# Patient Record
Sex: Female | Born: 1970 | Hispanic: No | Marital: Married | Smoking: Never smoker
Health system: Southern US, Community
[De-identification: ages and names within clinical notes are randomized; demographics above are authoritative.]

## PROBLEM LIST (undated history)

## (undated) ENCOUNTER — Inpatient Hospital Stay (HOSPITAL_COMMUNITY): Payer: Self-pay

## (undated) DIAGNOSIS — Z8719 Personal history of other diseases of the digestive system: Secondary | ICD-10-CM

## (undated) DIAGNOSIS — R519 Headache, unspecified: Secondary | ICD-10-CM

## (undated) DIAGNOSIS — D649 Anemia, unspecified: Secondary | ICD-10-CM

## (undated) DIAGNOSIS — Z833 Family history of diabetes mellitus: Secondary | ICD-10-CM

## (undated) DIAGNOSIS — Z6791 Unspecified blood type, Rh negative: Secondary | ICD-10-CM

## (undated) DIAGNOSIS — Z8613 Personal history of malaria: Secondary | ICD-10-CM

## (undated) DIAGNOSIS — O24419 Gestational diabetes mellitus in pregnancy, unspecified control: Secondary | ICD-10-CM

## (undated) DIAGNOSIS — Z22330 Carrier of Group B streptococcus: Secondary | ICD-10-CM

## (undated) DIAGNOSIS — Z862 Personal history of diseases of the blood and blood-forming organs and certain disorders involving the immune mechanism: Secondary | ICD-10-CM

## (undated) DIAGNOSIS — O26899 Other specified pregnancy related conditions, unspecified trimester: Secondary | ICD-10-CM

## (undated) DIAGNOSIS — K469 Unspecified abdominal hernia without obstruction or gangrene: Secondary | ICD-10-CM

## (undated) DIAGNOSIS — N90810 Female genital mutilation status, unspecified: Secondary | ICD-10-CM

## (undated) DIAGNOSIS — Z6741 Type O blood, Rh negative: Secondary | ICD-10-CM

## (undated) DIAGNOSIS — R51 Headache: Secondary | ICD-10-CM

## (undated) DIAGNOSIS — O09529 Supervision of elderly multigravida, unspecified trimester: Secondary | ICD-10-CM

## (undated) HISTORY — DX: Anemia, unspecified: D64.9

## (undated) HISTORY — DX: Family history of diabetes mellitus: Z83.3

## (undated) HISTORY — DX: Unspecified blood type, rh negative: Z67.91

## (undated) HISTORY — DX: Other specified pregnancy related conditions, unspecified trimester: O26.899

## (undated) HISTORY — DX: Female genital mutilation status, unspecified: N90.810

## (undated) HISTORY — PX: UPPER GI ENDOSCOPY: SHX6162

## (undated) HISTORY — DX: Personal history of diseases of the blood and blood-forming organs and certain disorders involving the immune mechanism: Z86.2

## (undated) HISTORY — DX: Personal history of other diseases of the digestive system: Z87.19

## (undated) HISTORY — DX: Personal history of malaria: Z86.13

## (undated) HISTORY — DX: Supervision of elderly multigravida, unspecified trimester: O09.529

## (undated) HISTORY — DX: Carrier of group B Streptococcus: Z22.330

---

## 2008-04-10 HISTORY — PX: GALLBLADDER SURGERY: SHX652

## 2008-12-27 ENCOUNTER — Inpatient Hospital Stay (HOSPITAL_COMMUNITY): Admission: AD | Admit: 2008-12-27 | Discharge: 2008-12-28 | Payer: Self-pay | Admitting: Obstetrics and Gynecology

## 2009-02-11 ENCOUNTER — Encounter: Admission: RE | Admit: 2009-02-11 | Discharge: 2009-02-11 | Payer: Self-pay | Admitting: Obstetrics and Gynecology

## 2009-02-11 ENCOUNTER — Ambulatory Visit (HOSPITAL_COMMUNITY): Admission: RE | Admit: 2009-02-11 | Discharge: 2009-02-11 | Payer: Self-pay | Admitting: Obstetrics and Gynecology

## 2009-02-23 ENCOUNTER — Inpatient Hospital Stay (HOSPITAL_COMMUNITY): Admission: AD | Admit: 2009-02-23 | Discharge: 2009-02-26 | Payer: Self-pay | Admitting: Obstetrics and Gynecology

## 2009-03-14 ENCOUNTER — Emergency Department (HOSPITAL_COMMUNITY): Admission: EM | Admit: 2009-03-14 | Discharge: 2009-03-14 | Payer: Self-pay | Admitting: Emergency Medicine

## 2009-03-29 ENCOUNTER — Ambulatory Visit: Payer: Self-pay | Admitting: Family Medicine

## 2009-03-29 ENCOUNTER — Inpatient Hospital Stay (HOSPITAL_COMMUNITY): Admission: EM | Admit: 2009-03-29 | Discharge: 2009-04-01 | Payer: Self-pay | Admitting: Emergency Medicine

## 2009-03-31 ENCOUNTER — Encounter: Payer: Self-pay | Admitting: Family Medicine

## 2009-04-07 DIAGNOSIS — Z8719 Personal history of other diseases of the digestive system: Secondary | ICD-10-CM

## 2009-04-07 HISTORY — DX: Personal history of other diseases of the digestive system: Z87.19

## 2009-09-02 DIAGNOSIS — Z8719 Personal history of other diseases of the digestive system: Secondary | ICD-10-CM

## 2009-09-02 HISTORY — DX: Personal history of other diseases of the digestive system: Z87.19

## 2010-07-11 LAB — BASIC METABOLIC PANEL
BUN: 7 mg/dL (ref 6–23)
CO2: 28 mEq/L (ref 19–32)
Calcium: 8.6 mg/dL (ref 8.4–10.5)
GFR calc non Af Amer: >60 mL/min (ref >60)
Glucose, Bld: 104 mg/dL — ABNORMAL HIGH (ref 70–99)
Sodium: 137 mEq/L (ref 135–145)

## 2010-07-11 LAB — CBC
Hemoglobin: 12.8 g/dL (ref 12.0–15.0)
MCHC: 33.5 g/dL (ref 30.0–36.0)
Platelets: 225 10*3/uL (ref 150–400)
RDW: 16 % — ABNORMAL HIGH (ref 11.5–15.5)

## 2010-07-11 LAB — COMPREHENSIVE METABOLIC PANEL
AST: 27 U/L (ref 0–37)
Albumin: 3.1 g/dL — ABNORMAL LOW (ref 3.5–5.2)
CO2: 28 mEq/L (ref 19–32)
Calcium: 8 mg/dL — ABNORMAL LOW (ref 8.4–10.5)
Creatinine, Ser: 0.54 mg/dL (ref 0.4–1.2)
GFR calc Af Amer: >60 mL/min (ref >60)
GFR calc non Af Amer: >60 mL/min (ref >60)
Total Protein: 6.1 g/dL (ref 6.0–8.3)

## 2010-07-11 LAB — URINALYSIS, MICROSCOPIC ONLY
Glucose, UA: NEGATIVE mg/dL
Hgb urine dipstick: NEGATIVE
Ketones, ur: NEGATIVE mg/dL
Protein, ur: NEGATIVE mg/dL

## 2010-07-11 LAB — DIFFERENTIAL
Basophils Absolute: 0 10*3/uL (ref 0.0–0.1)
Basophils Relative: 0 % (ref 0–1)
Eosinophils Relative: 1 % (ref 0–5)
Monocytes Absolute: 0.1 10*3/uL (ref 0.1–1.0)
Neutro Abs: 6.8 10*3/uL (ref 1.7–7.7)

## 2010-07-11 LAB — HEPATIC FUNCTION PANEL
AST: 130 U/L — ABNORMAL HIGH (ref 0–37)
Bilirubin, Direct: 0.4 mg/dL — ABNORMAL HIGH (ref 0.0–0.3)
Indirect Bilirubin: 0.5 mg/dL (ref 0.3–0.9)
Total Protein: 6.5 g/dL (ref 6.0–8.3)

## 2010-07-11 LAB — URINE CULTURE: Colony Count: 30000

## 2010-07-11 LAB — TYPE AND SCREEN
ABO/RH(D): O NEG
Antibody Screen: NEGATIVE

## 2010-07-12 LAB — BASIC METABOLIC PANEL
Calcium: 8.3 mg/dL — ABNORMAL LOW (ref 8.4–10.5)
GFR calc Af Amer: >60 mL/min (ref >60)
GFR calc non Af Amer: >60 mL/min (ref >60)
Glucose, Bld: 112 mg/dL — ABNORMAL HIGH (ref 70–99)
Sodium: 138 mEq/L (ref 135–145)

## 2010-07-12 LAB — DIFFERENTIAL
Basophils Absolute: 0.1 10*3/uL (ref 0.0–0.1)
Lymphocytes Relative: 21 % (ref 12–46)
Monocytes Absolute: 0.5 10*3/uL (ref 0.1–1.0)
Neutro Abs: 4.6 10*3/uL (ref 1.7–7.7)

## 2010-07-12 LAB — URINE MICROSCOPIC-ADD ON

## 2010-07-12 LAB — CBC
Hemoglobin: 11.8 g/dL — ABNORMAL LOW (ref 12.0–15.0)
RBC: 4.13 MIL/uL (ref 3.87–5.11)
RDW: 15.9 % — ABNORMAL HIGH (ref 11.5–15.5)

## 2010-07-12 LAB — URINALYSIS, ROUTINE W REFLEX MICROSCOPIC
Bilirubin Urine: NEGATIVE
Nitrite: NEGATIVE
Specific Gravity, Urine: 1.026 (ref 1.005–1.030)
pH: 5.5 (ref 5.0–8.0)

## 2010-07-12 LAB — POCT PREGNANCY, URINE: Preg Test, Ur: NEGATIVE

## 2010-07-13 LAB — CBC
HCT: 29.1 % — ABNORMAL LOW (ref 36.0–46.0)
HCT: 32.4 % — ABNORMAL LOW (ref 36.0–46.0)
Hemoglobin: 10.8 g/dL — ABNORMAL LOW (ref 12.0–15.0)
MCV: 87.3 fL (ref 78.0–100.0)
Platelets: 219 10*3/uL (ref 150–400)
RBC: 3.33 MIL/uL — ABNORMAL LOW (ref 3.87–5.11)
RDW: 14.1 % (ref 11.5–15.5)
WBC: 8.2 10*3/uL (ref 4.0–10.5)

## 2010-07-15 LAB — RH IMMUNE GLOBULIN WORKUP (NOT WOMEN'S HOSP)

## 2011-04-11 NOTE — L&D Delivery Note (Addendum)
Delivery Note At 9:10 AM a viable female, "   ", was delivered via Vaginal, Spontaneous Delivery (Presentation: Right Occiput Anterior).  APGAR: 9, 9; weight 7 lb 6.9 oz (3371 g).   Placenta status: Intact, Spontaneous.  Cord: 3 vessels with the following complications: None.  Cord pH: NA  Anesthesia: Epidural  Episiotomy: Median Lacerations: 2nd degree ML episiotomy, 1st degree tear of female circumcision area upward.  Red Robinson catheter inserted into the urethra for localization.  4-0 monocryl utilized for repair of the upward tear of the site of the previous clitorectomy. 3-0 monocryl used for repair of the MLE.  Rectal sphincter intact, good tone.  Additional Lidocaine infiltration used due to incomplete anesthesia from epidural. Suture Repair: 3.0 and 4.0 monocryl. Est. Blood Loss (mL): 300 cc  Mom to postpartum.  Baby to bedside care, stable. Inpatient circumcision planned.  Nigel Bridgeman 02/19/2012, 10:57 AM

## 2011-05-07 IMAGING — CR DG ABDOMEN ACUTE W/ 1V CHEST
3 series · 3 of 3 positions shown · non-contrast
Comparison: None

CLINICAL DATA: Abdominal pain.

ACUTE ABDOMEN SERIES (ABDOMEN 2 VIEW & CHEST 1 VIEW)

[w chest pa]
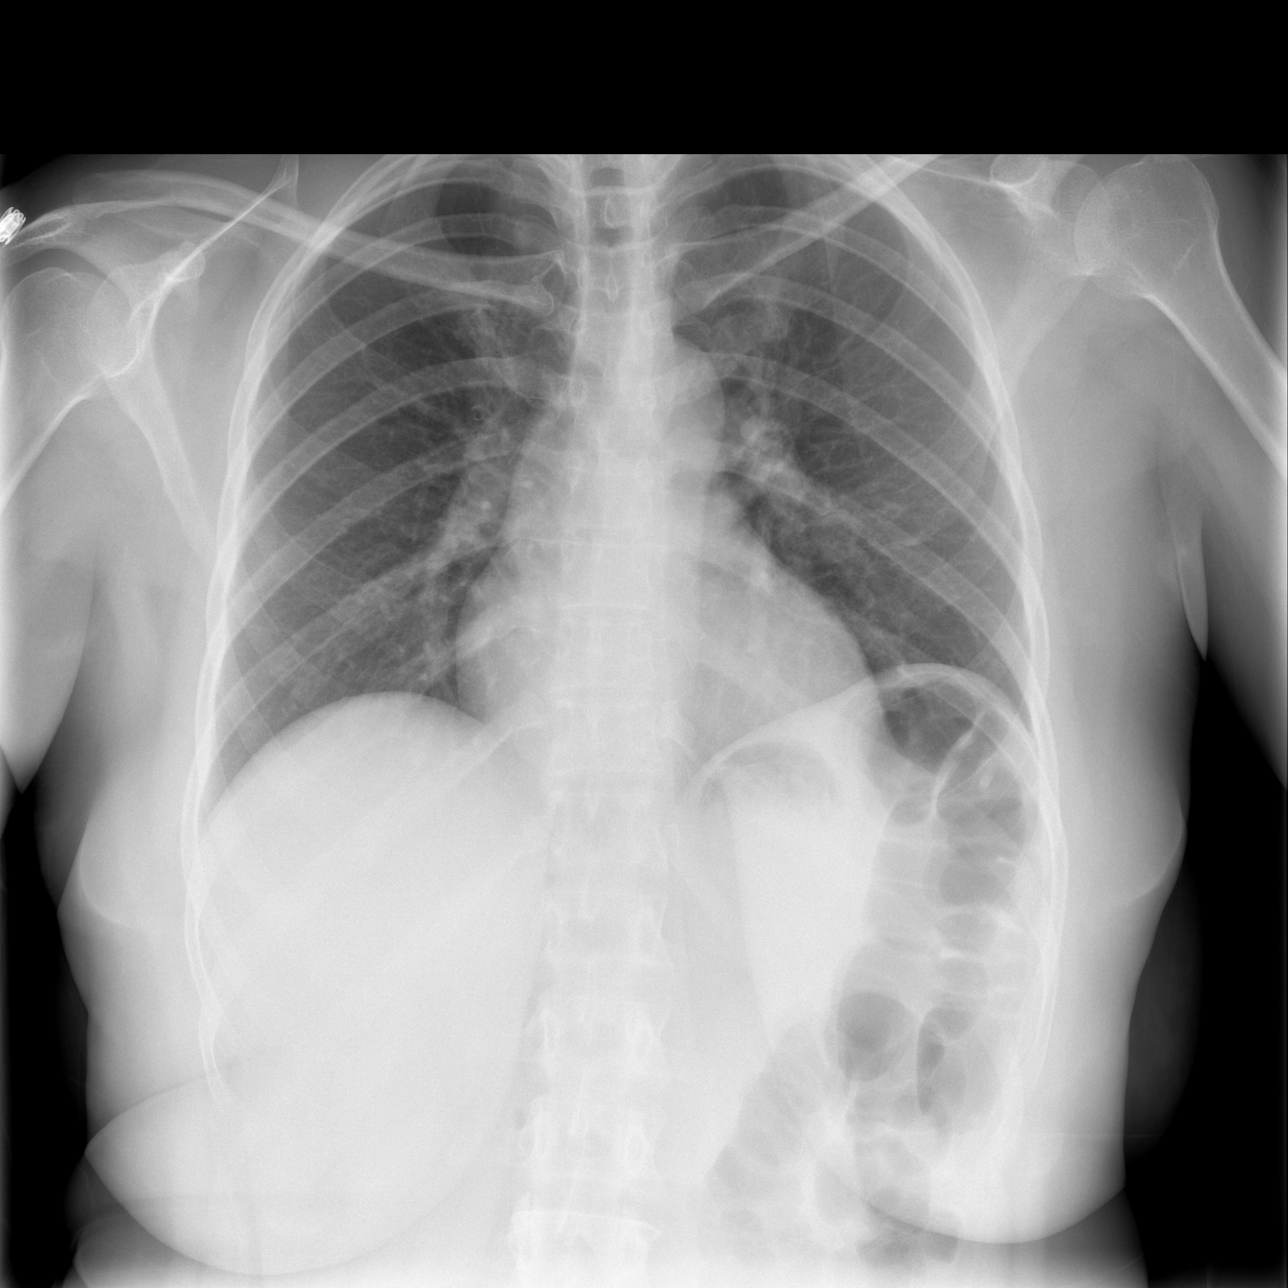

[w abdomen upright]
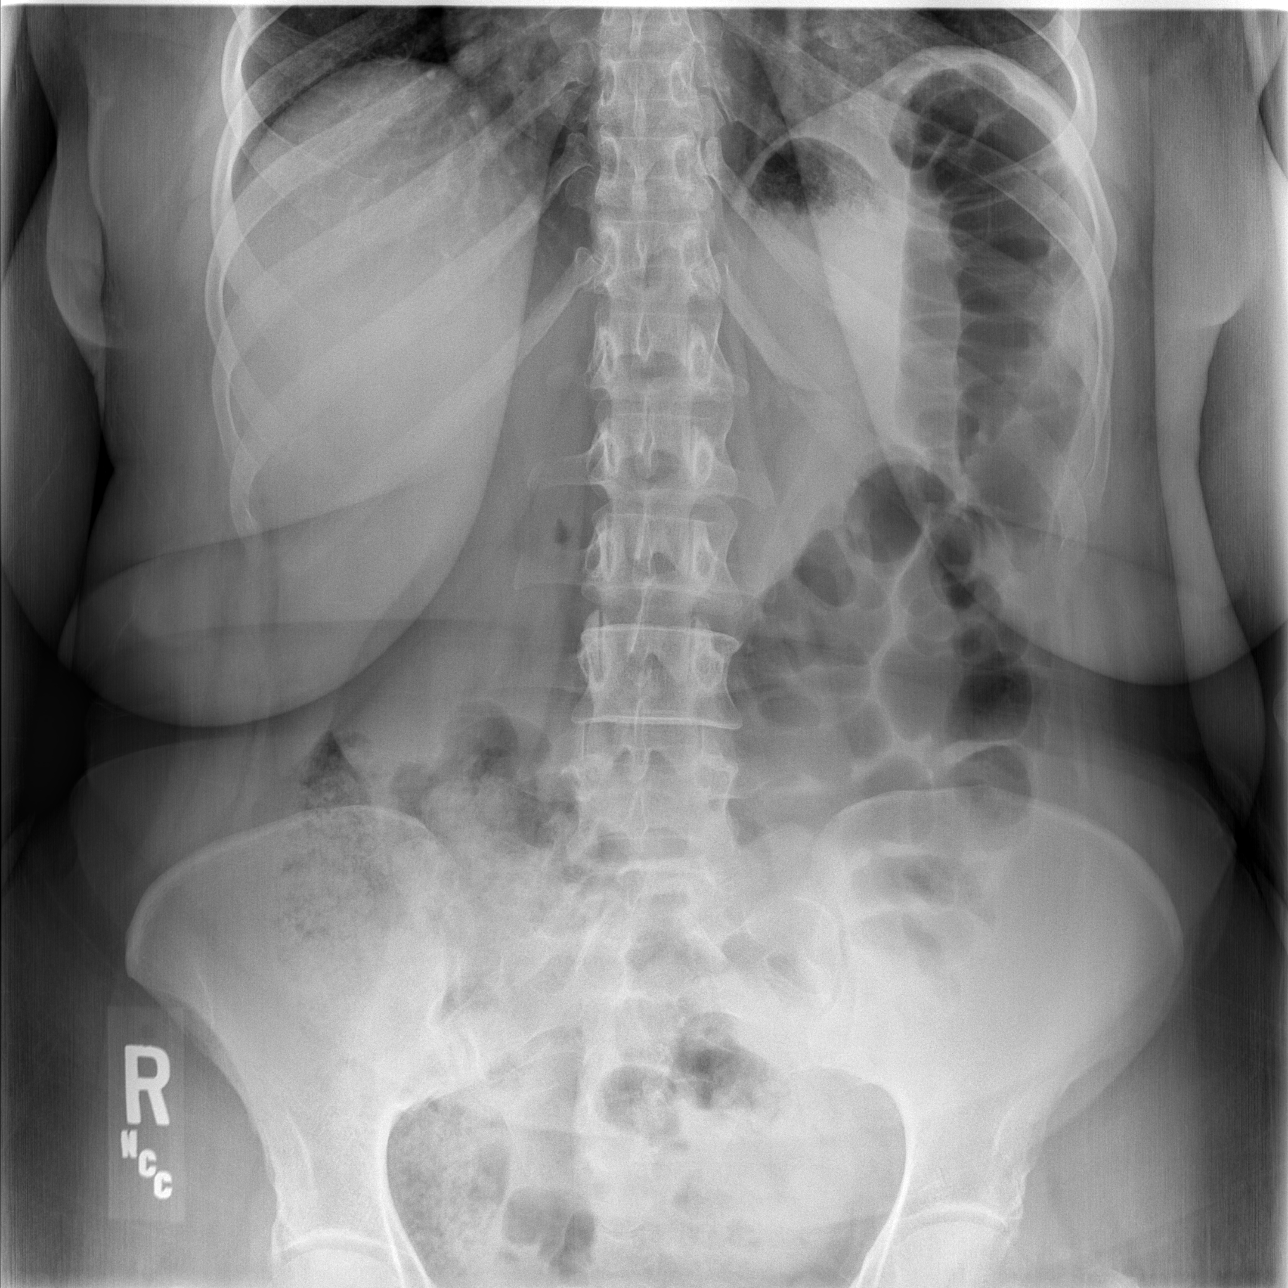

[t abdomen supine]
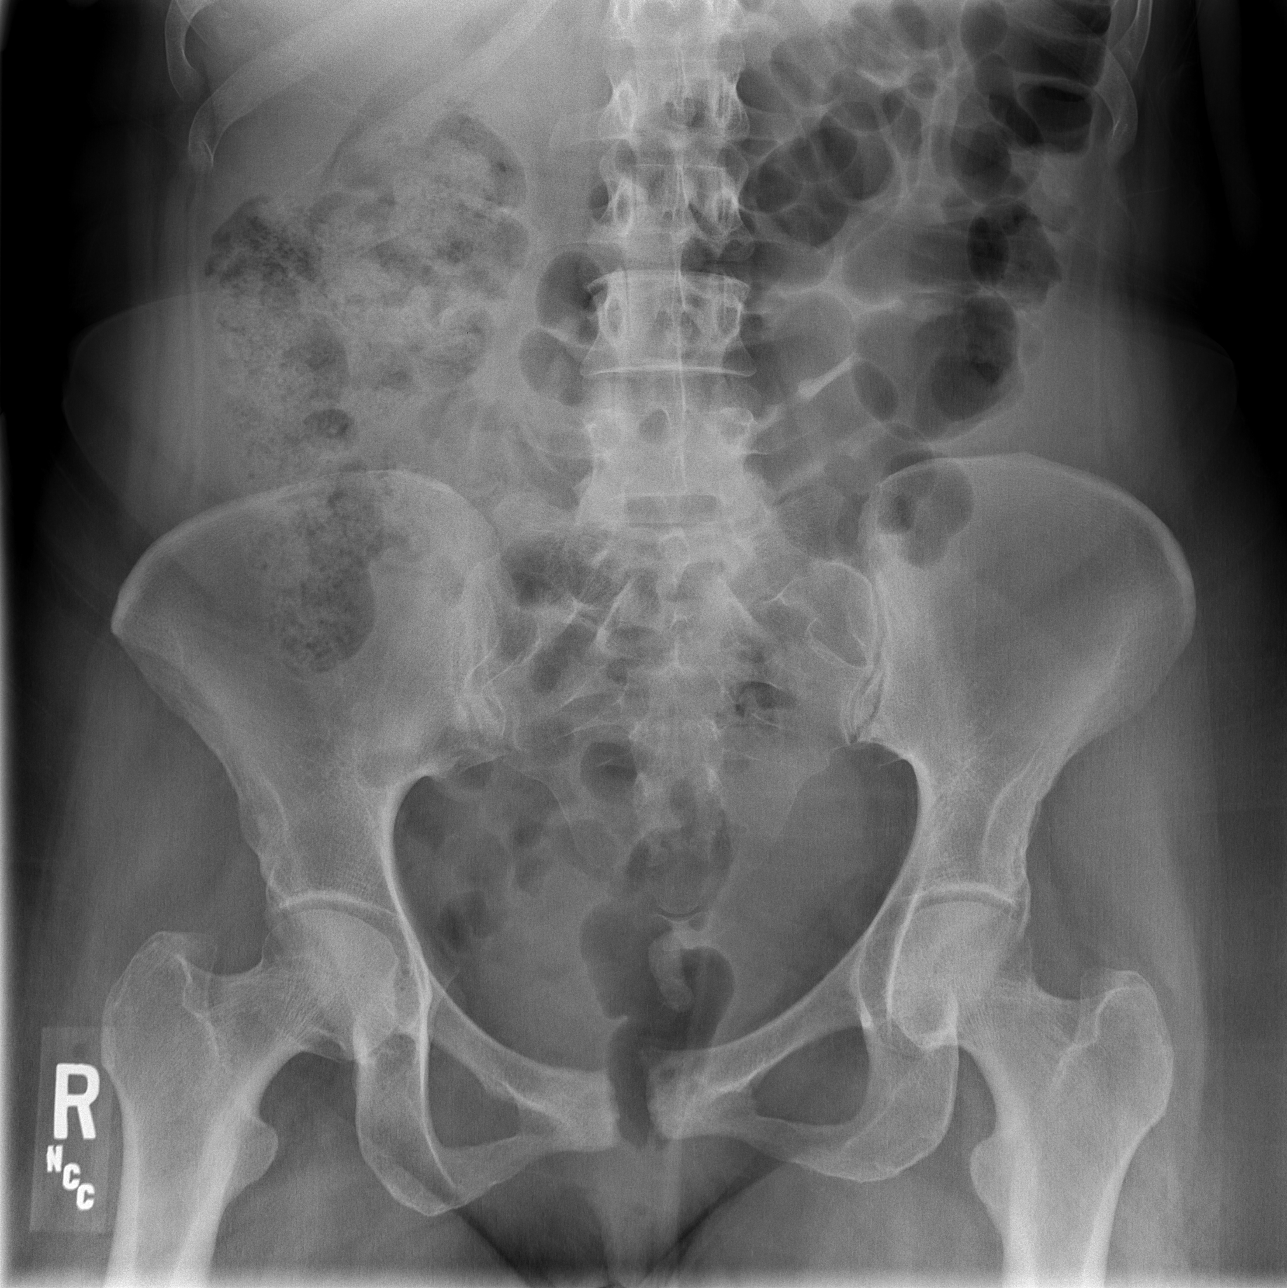

[3 of 3 positions shown; findings below may reference images not displayed]

FINDINGS: The bowel gas pattern is normal.  There is no evidence of
free intraperitoneal air.  No suspicious radio-opaque calculi or
other significant radiographic abnormality is seen. Heart size and
mediastinal contours are within normal limits.  Both lungs are
clear.
IMPRESSION: No acute findings.

## 2011-07-03 ENCOUNTER — Encounter (INDEPENDENT_AMBULATORY_CARE_PROVIDER_SITE_OTHER): Payer: 59 | Admitting: Obstetrics and Gynecology

## 2011-07-03 DIAGNOSIS — N912 Amenorrhea, unspecified: Secondary | ICD-10-CM

## 2011-07-18 ENCOUNTER — Telehealth: Payer: Self-pay | Admitting: Obstetrics and Gynecology

## 2011-07-19 ENCOUNTER — Other Ambulatory Visit: Payer: Self-pay | Admitting: Obstetrics and Gynecology

## 2011-07-19 ENCOUNTER — Telehealth: Payer: Self-pay | Admitting: Obstetrics and Gynecology

## 2011-07-19 MED ORDER — ONDANSETRON HCL 8 MG PO TABS: 8.0000 mg | ORAL_TABLET | Freq: Three times a day (TID) | ORAL | Status: AC | PRN

## 2011-07-19 NOTE — Telephone Encounter (Signed)
TC to pt. LM to return call.  

## 2011-07-19 NOTE — Telephone Encounter (Signed)
TC from pt's husband.  States is having vomiting 2x/day.  +Nausea.   Is tolerating food and fluid.  Will consult w/provider for RX.  Advised if takes Zofran to take Colace qd to prevent constipation.  To call if no improvement or unable to retain food or fluids x 24 hrs.   Verbalizes comprehension.

## 2011-07-20 MED ORDER — PROMETHAZINE HCL 25 MG PO TABS: 25.0000 mg | ORAL_TABLET | Freq: Four times a day (QID) | ORAL | Status: AC | PRN

## 2011-07-20 NOTE — Telephone Encounter (Signed)
TC to pt's husband.   States pt does not want to take Zofran due to hx of constipation  when taking 2010.  Requesting different medication for nausea. Per Dr Maryellen Pile RX for Phenergan ordered.   Advised to have pt wait 30-45 min after taking and the eat and drink .  Verbalizes comprehension.

## 2011-07-27 NOTE — Telephone Encounter (Signed)
Spoke with pt's husband.States pt is very fatigued with Phenergan.  Suggested taking half dose.   Use Sea [employer]ands.  Call if no improvement or N+V occur.  Verbalizes comprehension.

## 2011-07-28 ENCOUNTER — Telehealth: Payer: Self-pay

## 2011-07-28 NOTE — Telephone Encounter (Signed)
DENTAL NOTE FAXED TO [facility] [phone].

## 2011-08-03 DIAGNOSIS — Z789 Other specified health status: Secondary | ICD-10-CM

## 2011-08-04 ENCOUNTER — Encounter: Payer: 59 | Admitting: Obstetrics and Gynecology

## 2011-08-09 ENCOUNTER — Ambulatory Visit (INDEPENDENT_AMBULATORY_CARE_PROVIDER_SITE_OTHER): Payer: 59

## 2011-08-09 ENCOUNTER — Ambulatory Visit (INDEPENDENT_AMBULATORY_CARE_PROVIDER_SITE_OTHER): Payer: 59 | Admitting: Obstetrics and Gynecology

## 2011-08-09 VITALS — BP 114/70 | Ht 65.0 in | Wt 186.0 lb

## 2011-08-09 DIAGNOSIS — Z331 Pregnant state, incidental: Secondary | ICD-10-CM

## 2011-08-09 DIAGNOSIS — Z8613 Personal history of malaria: Secondary | ICD-10-CM | POA: Insufficient documentation

## 2011-08-09 LAB — POCT URINALYSIS DIPSTICK
Leukocytes, UA: NEGATIVE
Urobilinogen, UA: NEGATIVE

## 2011-08-09 NOTE — Progress Notes (Signed)
Pt states no bldg; however u a shows +3 blood  Lm. Pt c/o nausea

## 2011-08-10 ENCOUNTER — Encounter: Payer: 59 | Admitting: Obstetrics and Gynecology

## 2011-08-10 LAB — PRENATAL PANEL VII
Antibody Screen: NEGATIVE
Basophils Absolute: 0 10*3/uL (ref 0.0–0.1)
Basophils Relative: 0 % (ref 0–1)
Eosinophils Relative: 2 % (ref 0–5)
HIV: NONREACTIVE
Lymphocytes Relative: 42 % (ref 12–46)
Neutro Abs: 2.1 10*3/uL (ref 1.7–7.7)
Platelets: 244 10*3/uL (ref 150–400)
RDW: 12.8 % (ref 11.5–15.5)
WBC: 4.5 10*3/uL (ref 4.0–10.5)

## 2011-08-11 LAB — HEMOGLOBINOPATHY EVALUATION
Hgb A2 Quant: 2.9 % (ref 2.2–3.2)
Hgb F Quant: 0 % (ref 0.0–2.0)
Hgb S Quant: 0 %

## 2011-08-11 LAB — CULTURE, OB URINE: Organism ID, Bacteria: NO GROWTH

## 2011-08-15 ENCOUNTER — Other Ambulatory Visit: Payer: Self-pay | Admitting: Obstetrics and Gynecology

## 2011-08-15 NOTE — Telephone Encounter (Signed)
Triage received 

## 2011-08-16 ENCOUNTER — Other Ambulatory Visit: Payer: Self-pay

## 2011-08-16 ENCOUNTER — Telehealth: Payer: Self-pay

## 2011-08-16 MED ORDER — VITAFOL-ONE 29-1-200 MG PO CAPS: 1.0000 | ORAL_CAPSULE | Freq: Every day | ORAL | Status: DC

## 2011-08-16 NOTE — Telephone Encounter (Signed)
Rx Vitafol-One called in to [facility] per [employer] was left for pt to call back regarding Rx request.

## 2011-08-16 NOTE — Telephone Encounter (Signed)
Tc from pt's husband. Needs rf on pnv's. Rx for Vitafol e-pres to pharm on file. Caller voices understanding.

## 2011-08-29 ENCOUNTER — Encounter: Payer: Self-pay | Admitting: Obstetrics and Gynecology

## 2011-08-29 ENCOUNTER — Ambulatory Visit (INDEPENDENT_AMBULATORY_CARE_PROVIDER_SITE_OTHER): Payer: 59 | Admitting: Obstetrics and Gynecology

## 2011-08-29 VITALS — BP 110/70 | Wt 194.0 lb

## 2011-08-29 DIAGNOSIS — O09299 Supervision of pregnancy with other poor reproductive or obstetric history, unspecified trimester: Secondary | ICD-10-CM

## 2011-08-29 DIAGNOSIS — O09529 Supervision of elderly multigravida, unspecified trimester: Secondary | ICD-10-CM

## 2011-08-29 DIAGNOSIS — K219 Gastro-esophageal reflux disease without esophagitis: Secondary | ICD-10-CM

## 2011-08-29 DIAGNOSIS — O219 Vomiting of pregnancy, unspecified: Secondary | ICD-10-CM

## 2011-08-29 MED ORDER — PANTOPRAZOLE SODIUM 40 MG PO TBEC: 40.0000 mg | DELAYED_RELEASE_TABLET | Freq: Every day | ORAL | Status: DC

## 2011-08-29 NOTE — Progress Notes (Signed)
Pap done 04/2011 WNL  C/o acid reflux after eating  Pt declines genetic screenings

## 2011-08-29 NOTE — Progress Notes (Signed)
Subjective:    Jessica Cervantes is being seen today for her first obstetrical visit.  She is at [redacted]w[redacted]d gestation by LMP.   She reports continuing issues with nausea and vomiting, but improving. Has used Zofran and Phenergan, but has stopped both due to side effects of constipation and sedation, respectively.  Her obstetrical history is significant for: AMA Advanced paternal age Hx malaria Language barrier (Arabic)--speaks some English Reflux Hx gestational diabetes with previous pregnancy  Relationship with FOB: spouse, living together.  Patient does intend to breast feed.  Pregnancy history fully reviewed.   The following portions of the patient's history were reviewed and updated as appropriate: allergies, current medications, past family history, past medical history, past social history, past surgical history and problem list.  Review of Systems Pertinent ROS is described in HPI   Objective:   BP 110/70  Wt 194 lb (87.998 kg)  LMP 05/23/2011 Wt Readings from Last 1 Encounters:  08/29/11 194 lb (87.998 kg)   BMI: There is no height on file to calculate BMI.  General: alert, cooperative and no distress Respiratory: clear to auscultation bilaterally Cardiovascular: regular rate and rhythm, S1, S2 normal, no murmur Gastrointestinal: soft, non-tender; no masses,  no organomegaly Extremities: extremities normal, no pain or edema Vaginal Bleeding: none  EXTERNAL GENITALIA: normal appearing vulva with no masses, tenderness or lesions VAGINA: no abnormal discharge or lesions CERVIX: no lesions or cervical motion tenderness UTERUS: gravid and consistent with 14 weeks ADNEXA: no masses palpable and nontender OB EXAM PELVIMETRY: appears adequate, proven   FHR:  150  bpm  Assessment:    Pregnancy at 14 and 0/7 weeks  Pregnancy risk status:  complicated by advanced maternal age and hx gestational diabetes, advanced paternal age, language barrier.    Plan:     Prenatal  panel reviewed and discussed with the patient:yes Pap smear collected:No--due in January, 2014. GC/Chlamydia collected:no, declined. Wet prep: NA Discussion of Quad Screen, Amniocentesis and Harmony (too late for 1st trimester screen)  undecided initially, then planned Quad screen.  However, I felt we had not talked adequately about Harmony--I asked CMA to call patient after visit to review again, to make sure patient did not want Harmony testing. Prenatal vitamins recommended Problem list reviewed and updated.  Plan of care: Follow up in 4 weeks. Additional testing planned: Early glucola, Level 2 anatomy scan at 18 weeks ([facility]), with early glucola Prescriptions given: Protonix for reflux.  Arthur Holms 08/29/2011 12:21 PM

## 2011-08-30 ENCOUNTER — Telehealth: Payer: Self-pay

## 2011-08-30 NOTE — Telephone Encounter (Signed)
Message copied by Janeece Agee on Wed Aug 30, 2011  8:56 AM ------      Message from: Cornelius Moras      Created: Wed Aug 30, 2011  8:02 AM      Regarding: Question about Cathlean Sauer       When I saw this patient yesterday, she was too late for 1st trimester screen, so we talked about Quad screen at [facility]--I forgot to ask her about Harmony...Marland KitchenMarland KitchenMarland Kitchen            Would you see if we can contact her (actually her husband), and see if she wants Harmony before the next visit?            My bad.........Marland Kitchen            VL

## 2011-08-31 ENCOUNTER — Other Ambulatory Visit: Payer: Self-pay

## 2011-08-31 DIAGNOSIS — Z1379 Encounter for other screening for genetic and chromosomal anomalies: Secondary | ICD-10-CM

## 2011-08-31 NOTE — Telephone Encounter (Signed)
Spoke with pt's husband due to wife's lack of English informing him we offered another 1st tri screening test called Harmony Test. Husband agreed to test. Appointment scheduled tomorrow at 3:30 pm for lab only visit. Pt's husband agrees and voices understanding.

## 2011-09-03 ENCOUNTER — Ambulatory Visit (INDEPENDENT_AMBULATORY_CARE_PROVIDER_SITE_OTHER): Payer: 59 | Admitting: Family Medicine

## 2011-09-03 VITALS — BP 93/63 | HR 89 | Temp 98.3°F | Resp 18 | Ht 65.0 in | Wt 195.0 lb

## 2011-09-03 DIAGNOSIS — N644 Mastodynia: Secondary | ICD-10-CM

## 2011-09-03 MED ORDER — CEPHALEXIN 500 MG PO CAPS: 500.0000 mg | ORAL_CAPSULE | Freq: Four times a day (QID) | ORAL | Status: AC

## 2011-09-03 NOTE — Progress Notes (Signed)
  Subjective:    Patient ID: Jessica Cervantes, female    DOB: 21-Oct-1970, 41 y.o.   MRN: [id number]  HPI  (L) breast swelling and soreness [redacted] weeks pregnant  Denies fever/chills Denies nipple discharge or skin changes  Stopped nursing son 1 year ago.; no history of mastitis  Review of Systems     Objective:   Physical Exam  Constitutional: She appears well-developed.  HENT:  Head: Normocephalic and atraumatic.  Neck: Neck supple. No thyromegaly present.  Cardiovascular: Normal rate, regular rhythm and normal heart sounds.   Pulmonary/Chest: Effort normal and breath sounds normal. Right breast exhibits no inverted nipple, no mass, no nipple discharge and no skin change. Left breast exhibits mass and tenderness. Left breast exhibits no inverted nipple, no nipple discharge and no skin change.    Neurological: She is alert.  Skin: Skin is warm.        Assessment & Plan:   1. Breast pain; tender mass cephALEXin (KEFLEX) 500 MG capsule, massage and heat   [facility] notified Harriett Sine) and they will contact patient for appointment this week.

## 2011-09-05 ENCOUNTER — Telehealth: Payer: Self-pay | Admitting: Obstetrics and Gynecology

## 2011-09-05 ENCOUNTER — Encounter: Payer: Self-pay | Admitting: Family Medicine

## 2011-09-05 NOTE — Telephone Encounter (Signed)
TC from Renee at [facility].  Pt was seen today by Dr Hal Hope for breast swelling and tenderness.  Firmness noted and questioning if U/S should be ordered at [facility] or pt to f/u with our office.  Pt is [redacted] wks pregnant.  Per SL to scheduled appt here for f/u.   TC to pt.   LM to return  Call.

## 2011-09-05 NOTE — Telephone Encounter (Signed)
Triage/elect. 

## 2011-09-05 NOTE — Telephone Encounter (Signed)
TC from pt's spouse.   Sched for evalw/ Dr SR 09/05/28.

## 2011-09-06 ENCOUNTER — Ambulatory Visit (INDEPENDENT_AMBULATORY_CARE_PROVIDER_SITE_OTHER): Payer: 59 | Admitting: Obstetrics and Gynecology

## 2011-09-06 VITALS — BP 110/60 | Wt 195.0 lb

## 2011-09-06 DIAGNOSIS — N644 Mastodynia: Secondary | ICD-10-CM

## 2011-09-06 DIAGNOSIS — O09529 Supervision of elderly multigravida, unspecified trimester: Secondary | ICD-10-CM

## 2011-09-06 NOTE — Progress Notes (Signed)
F/u breast tenderness . Pt stated no other issues .

## 2011-09-06 NOTE — Progress Notes (Signed)
Here today for Left breast exam. Was seen at [facility] for acute superficial breast pain and swelling. Given Keflex. Now pain resolved. Breast: small healing skin puncture with desquamation on 1 cm area. O/W breast exam is normal Suspect insect bite, healing well

## 2011-09-12 ENCOUNTER — Other Ambulatory Visit: Payer: Self-pay | Admitting: Obstetrics and Gynecology

## 2011-09-12 DIAGNOSIS — Z1379 Encounter for other screening for genetic and chromosomal anomalies: Secondary | ICD-10-CM

## 2011-09-25 ENCOUNTER — Ambulatory Visit (INDEPENDENT_AMBULATORY_CARE_PROVIDER_SITE_OTHER): Payer: 59 | Admitting: Obstetrics and Gynecology

## 2011-09-25 ENCOUNTER — Ambulatory Visit (INDEPENDENT_AMBULATORY_CARE_PROVIDER_SITE_OTHER): Payer: 59

## 2011-09-25 ENCOUNTER — Encounter: Payer: Self-pay | Admitting: Obstetrics and Gynecology

## 2011-09-25 ENCOUNTER — Other Ambulatory Visit: Payer: Self-pay | Admitting: Obstetrics and Gynecology

## 2011-09-25 VITALS — BP 110/62 | Wt 200.0 lb

## 2011-09-25 DIAGNOSIS — O09529 Supervision of elderly multigravida, unspecified trimester: Secondary | ICD-10-CM

## 2011-09-25 DIAGNOSIS — R059 Cough, unspecified: Secondary | ICD-10-CM

## 2011-09-25 DIAGNOSIS — Z36 Encounter for antenatal screening of mother: Secondary | ICD-10-CM

## 2011-09-25 DIAGNOSIS — R05 Cough: Secondary | ICD-10-CM

## 2011-09-25 MED ORDER — GUAIFENESIN-CODEINE 100-10 MG/5ML PO SYRP: 5.0000 mL | ORAL_SOLUTION | Freq: Three times a day (TID) | ORAL | Status: AC | PRN

## 2011-09-25 NOTE — Progress Notes (Signed)
Doing well except for hacking cough, non-productive, no fever or other URI sx Wants Rx for cough--Rx Robitussin AC to pharmacy. Encouraged patient to take Protonix as Rx'd--may help if some of cough is reflux related. Needs early glucola due to hx of GDM--will plan at [facility], since patient can't stay longer today. Declined genetic testing.  [place] today:  18 w 4 day, SIUP, cx 3.58. Anterior placenta.  Female infant, normal anatomy.  EDC 02/22/12, c/w previous dating.

## 2011-09-25 NOTE — Progress Notes (Signed)
No concerns per pt 

## 2011-09-26 LAB — US OB DETAIL + 14 WK

## 2011-09-28 NOTE — Progress Notes (Signed)
Plan this patient preferred not to see a female provider.  Her exam was rescheduled.

## 2011-10-09 ENCOUNTER — Telehealth: Payer: Self-pay | Admitting: Obstetrics and Gynecology

## 2011-10-09 NOTE — Telephone Encounter (Signed)
Spoke with pt husband states wife has severe coughing states rx given at last visit not working cough getting worse offered an appt pt has appt 10/10/11 at 4:15 husband voice understanding

## 2011-10-09 NOTE — Telephone Encounter (Signed)
Lm on vm tcb rgd msg 

## 2011-10-09 NOTE — Telephone Encounter (Signed)
Triage/epic 

## 2011-10-10 ENCOUNTER — Ambulatory Visit (INDEPENDENT_AMBULATORY_CARE_PROVIDER_SITE_OTHER): Payer: 59 | Admitting: Obstetrics and Gynecology

## 2011-10-10 VITALS — BP 100/78 | Wt 203.0 lb

## 2011-10-10 DIAGNOSIS — R05 Cough: Secondary | ICD-10-CM | POA: Insufficient documentation

## 2011-10-10 DIAGNOSIS — Z3490 Encounter for supervision of normal pregnancy, unspecified, unspecified trimester: Secondary | ICD-10-CM

## 2011-10-10 DIAGNOSIS — Z331 Pregnant state, incidental: Secondary | ICD-10-CM

## 2011-10-10 NOTE — Progress Notes (Signed)
C/o persistent cough for past 37 days unrelieved by Robitussin AC and patient can not sleep at night and has chest tenderness from persistent coughing. Lungs: Bilat Clear, no hx of pulmonary disease.  Referral to Pulmonologist. [place]  Requesting appt asap   Request to Triage pool  F/u progress of getting appt in am F/u Ob appt with VL 10/26/11 - scheduled.

## 2011-10-10 NOTE — Progress Notes (Signed)
C/O: chest pain when coughing  x 1 month  and 1 week now. Pt husband is here to translate and states the prescribed Rx Robitussin  did not help help w/ cough.  Temp 98.6; Oral

## 2011-10-11 ENCOUNTER — Telehealth: Payer: Self-pay

## 2011-10-11 ENCOUNTER — Other Ambulatory Visit: Payer: Self-pay

## 2011-10-11 NOTE — Telephone Encounter (Signed)
LM for pt regarding [facility] appt scheduled Aug. 12th @ 3:45 pm.

## 2011-10-11 NOTE — Telephone Encounter (Signed)
A user error has taken place: encounter opened in error, closed for administrative reasons.

## 2011-10-11 NOTE — Telephone Encounter (Signed)
Spoke with pt's husband and translater informing them appt is scheduled 11/20/11 @ 3:45 pm with Dr. Levy Pupa @ [facility]. Pt agrees and voices understanding.

## 2011-10-24 ENCOUNTER — Encounter: Payer: Self-pay | Admitting: Internal Medicine

## 2011-10-24 ENCOUNTER — Ambulatory Visit (INDEPENDENT_AMBULATORY_CARE_PROVIDER_SITE_OTHER): Payer: 59 | Admitting: Internal Medicine

## 2011-10-24 VITALS — BP 102/62 | HR 75 | Temp 98.1°F | Ht 65.0 in | Wt 198.0 lb

## 2011-10-24 DIAGNOSIS — R05 Cough: Secondary | ICD-10-CM

## 2011-10-24 MED ORDER — AMOXICILLIN-POT CLAVULANATE 875-125 MG PO TABS: 1.0000 | ORAL_TABLET | Freq: Two times a day (BID) | ORAL | Status: AC

## 2011-10-24 MED ORDER — PREDNISONE (PAK) 10 MG PO TABS: ORAL_TABLET | ORAL | Status: AC

## 2011-10-24 MED ORDER — LANSOPRAZOLE 30 MG PO CPDR: DELAYED_RELEASE_CAPSULE | ORAL | Status: DC

## 2011-10-24 NOTE — Progress Notes (Signed)
  Subjective:    Patient ID: Jessica Cervantes, female    DOB: 12-Feb-1971  MRN: [id number]  HPI  41 yo sudanese female never smoker with recurrent cough x 2010 lasts for months referred 10/24/2011 for recurrent cough while pregnant with 3rd child  10/24/2011 Wert/ ov cc acute onset cough x 3 months, waxes and wanes, worse at hs > white mostly but occ yellow mucus, mild doe but no ressing sob, assoc with nasal congestion.  No overt hb or sinus complaints  Sleeping ok without  early am exacerbation  of respiratory  c/o's or need for noct saba. Also denies any obvious fluctuation of symptoms with weather or environmental changes or other aggravating or alleviating factors except as outlined above   Review of Systems  Constitutional: Negative for fever, chills and unexpected weight change.  HENT: Positive for congestion, sore throat and dental problem. Negative for ear pain, nosebleeds, rhinorrhea, sneezing, trouble swallowing, voice change, postnasal drip and sinus pressure.   Eyes: Negative for visual disturbance.  Respiratory: Positive for cough and shortness of breath. Negative for choking.   Cardiovascular: Negative for chest pain and leg swelling.  Gastrointestinal: Negative for vomiting, abdominal pain and diarrhea.  Genitourinary: Negative for difficulty urinating.  Musculoskeletal: Negative for arthralgias.  Skin: Negative for rash.  Neurological: Negative for tremors, syncope and headaches.  Hematological: Does not bruise/bleed easily.       Objective:   Physical Exam amb sudanese female nad, congested sounding cough. Wt Readings from Last 3 Encounters:  10/26/11 202 lb (91.627 kg)  10/24/11 198 lb (89.812 kg)  10/10/11 203 lb (92.08 kg)    HEENT: nl dentition, turbinates, and orophanx. Nl external ear canals without cough reflex   NECK :  without JVD/Nodes/TM/ nl carotid upstrokes bilaterally   LUNGS: no acc muscle use, clear to A and P bilaterally without cough on insp or  exp maneuvers   CV:  RRR  no s3 or murmur or increase in P2, no edema   ABD:  soft and nontender with nl excursion in the supine position. No bruits or organomegaly, bowel sounds nl  MS:  warm without deformities, calf tenderness, cyanosis or clubbing  SKIN: warm and dry without lesions    NEURO:  alert, approp, no deficits         Assessment & Plan:

## 2011-10-24 NOTE — Patient Instructions (Addendum)
augmentin 875 twice daily x 10 days  Prednisone 10 mg take  4 each am x 2 days,   2 each am x 2 days,  1 each am x2days and stop  Prevacid 30 mg before bfast and supper until return   Robitussin with Codeine is a cough syrup to take every 4 hours   Please schedule a follow up office visit in 2 weeks, sooner if needed

## 2011-10-26 ENCOUNTER — Encounter: Payer: Self-pay | Admitting: Obstetrics and Gynecology

## 2011-10-26 ENCOUNTER — Other Ambulatory Visit: Payer: 59

## 2011-10-26 ENCOUNTER — Ambulatory Visit (INDEPENDENT_AMBULATORY_CARE_PROVIDER_SITE_OTHER): Payer: 59 | Admitting: Obstetrics and Gynecology

## 2011-10-26 VITALS — BP 100/68 | Wt 202.0 lb

## 2011-10-26 DIAGNOSIS — R05 Cough: Secondary | ICD-10-CM

## 2011-10-26 NOTE — Progress Notes (Signed)
Glucola today. Some low back and upper leg pain, particularly at night when tired.  No UTI symptoms, no leg swelling. Comfort measures reviewed. Cough better.

## 2011-10-26 NOTE — Progress Notes (Signed)
1 GTT GIVEN TODAY WITHOUT DIFFICULTY

## 2011-10-28 ENCOUNTER — Encounter: Payer: Self-pay | Admitting: Obstetrics and Gynecology

## 2011-10-28 LAB — GLUCOSE TOLERANCE, 1 HOUR (50G) W/O FASTING: Glucose, 1 Hour GTT: 239 mg/dL — ABNORMAL HIGH (ref 70–140)

## 2011-10-29 ENCOUNTER — Encounter: Payer: Self-pay | Admitting: Internal Medicine

## 2011-10-29 NOTE — Assessment & Plan Note (Signed)
The most common causes of chronic cough in immunocompetent adults include the following: upper airway cough syndrome (UACS), previously referred to as postnasal drip syndrome (PNDS), which is caused by variety of rhinosinus conditions; (2) asthma; (3) GERD; (4) chronic bronchitis from cigarette smoking or other inhaled environmental irritants; (5) nonasthmatic eosinophilic bronchitis; and (6) bronchiectasis.   These conditions, singly or in combination, have accounted for up to 94% of the causes of chronic cough in prospective studies.   Other conditions have constituted no >6% of the causes in prospective studies These have included bronchogenic carcinoma, chronic interstitial pneumonia, sarcoidosis, left ventricular failure, ACEI-induced cough, and aspiration from a condition associated with pharyngeal dysfunction.   .Chronic cough is often simultaneously caused by more than one condition. A single cause has been found from 38 to 82% of the time, multiple causes from 18 to 62%. Multiply caused cough has been the result of three diseases up to 42% of the time.      This is most c/w  Classic Upper airway cough syndrome, so named because it's frequently impossible to sort out how much is  CR/sinusitis with freq throat clearing (which can be related to primary GERD)   vs  causing  secondary (" extra esophageal")  GERD from wide swings in gastric pressure that occur with throat clearing, often  promoting self use of mint and menthol lozenges that reduce the lower esophageal sphincter tone and exacerbate the problem further in a cyclical fashion.   These are the same pts (now being labeled as having "irritable larynx syndrome" by some cough centers) who not infrequently have a history of having failed to tolerate ace inhibitors,  dry powder inhalers or biphosphonates or report having atypical reflux symptoms that don't respond to standard doses of PPI , and are easily confused as having aecopd or asthma  flares by even experienced allergists/ pulmonologists.   Try rx for sinus dz empirically on max gerd rx then regroup.  The risk to a fetus is minimal at this age gestation while severe chronic cough can't be good for intrauterine pressures.  See instructions for specific recommendations which were reviewed directly with the patient who was given a copy with highlighter outlining the key components.

## 2011-11-03 ENCOUNTER — Telehealth: Payer: Self-pay

## 2011-11-03 DIAGNOSIS — O99810 Abnormal glucose complicating pregnancy: Secondary | ICD-10-CM

## 2011-11-08 ENCOUNTER — Ambulatory Visit: Payer: 59 | Admitting: Obstetrics and Gynecology

## 2011-11-08 ENCOUNTER — Telehealth: Payer: Self-pay | Admitting: Obstetrics and Gynecology

## 2011-11-08 NOTE — Telephone Encounter (Signed)
Please see msg

## 2011-11-08 NOTE — Telephone Encounter (Signed)
Informed pt's husband Ileana Ladd HIPAA approved (pt has a language) barrier [facility] referral is needed per [facility] due to 1 gtt 239 results. Informed pt's husband they will call them to set up the appointment. [place] agrees and voices understanding.

## 2011-11-09 NOTE — Telephone Encounter (Signed)
Spoke with pt husband regarding his message. Informed pt's husband per Theodoro Grist @ [facility] they have the order and are attempting to contact him. Per [employer] [employer] [facility], pt can call [phone] to get into this Weds class. Husband states he thought N&D was the same program as WIC. Informed pt WIC is a totally different nutrition program. Pt's husband agrees and voices understanding.

## 2011-11-13 ENCOUNTER — Ambulatory Visit (INDEPENDENT_AMBULATORY_CARE_PROVIDER_SITE_OTHER): Payer: 59 | Admitting: Internal Medicine

## 2011-11-13 ENCOUNTER — Encounter: Payer: Self-pay | Admitting: Internal Medicine

## 2011-11-13 VITALS — BP 100/60 | HR 101 | Temp 98.2°F | Ht 68.0 in | Wt 204.0 lb

## 2011-11-13 DIAGNOSIS — R05 Cough: Secondary | ICD-10-CM

## 2011-11-13 NOTE — Assessment & Plan Note (Signed)
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   These are the same pts (now being labeled as having "irritable larynx syndrome" by some cough centers) who not infrequently have a history of having failed to tolerate ace inhibitors,  dry powder inhalers or biphosphonates or report having atypical reflux symptoms that don't respond to standard doses of PPI , and are easily confused as having aecopd or asthma flares by even experienced allergists/ pulmonologists.   For now no further rx but low threshold to challenge with high dose ppi as the risk of GERD increases with age and during the 3rd trimester.

## 2011-11-13 NOTE — Progress Notes (Signed)
  Subjective:    Patient ID: Jessica Cervantes, female    DOB: 03/29/1971  MRN: [id number]  HPI  41 yo sudanese female never smoker with recurrent cough x 2010 lasts for months referred 10/24/2011 for recurrent cough while pregnant with 3rd child with due date 02/28/12  10/24/2011 Wert/ ov cc acute onset cough x 3 months, waxes and wanes, worse at hs > white mostly but occ yellow mucus, mild doe but no resting sob, assoc with nasal congestion.  No overt hb or sinus complaints. rec augmentin 875 twice daily x 10 days Prednisone 10 mg take  4 each am x 2 days,   2 each am x 2 days,  1 each am x2days and stop Prevacid 30 mg before bfast and supper until return  Robitussin with Codeine is a cough syrup to take every 4 hours   11/13/2011 f/u ov/Wert cc cough much better but apparently never took PPI.  No longer flaring at hs, no sob. No obvious daytime variabilty or assoc   cp or chest tightness, subjective wheeze overt sinus or hb symptoms. No unusual exp hx   Sleeping ok without  early am exacerbation  of respiratory  c/o's or need for noct saba. Also denies any obvious fluctuation of symptoms with weather or environmental changes or other aggravating or alleviating factors except as outlined above   ROS  The following are not active complaints unless bolded sore throat, dysphagia, dental problems, itching, sneezing,  nasal congestion or excess/ purulent secretions, ear ache,   fever, chills, sweats, unintended wt loss, pleuritic or exertional cp, hemoptysis,  orthopnea pnd or leg swelling, presyncope, palpitations, heartburn, abdominal pain, anorexia, nausea, vomiting, diarrhea  or change in bowel or urinary habits, change in stools or urine, dysuria,hematuria,  rash, arthralgias, visual complaints, headache, numbness weakness or ataxia or problems with walking or coordination,  change in mood/affect or memory.            Objective:   Physical Exam amb sudanese female nad, congested sounding  cough. Wt 204 11/13/2011  Wt Readings from Last 3 Encounters:  10/26/11 202 lb (91.627 kg)  10/24/11 198 lb (89.812 kg)  10/10/11 203 lb (92.08 kg)    HEENT: nl dentition, turbinates, and orophanx. Nl external ear canals without cough reflex   NECK :  without JVD/Nodes/TM/ nl carotid upstrokes bilaterally   LUNGS: no acc muscle use, clear to A and P bilaterally without cough on insp or exp maneuvers   CV:  RRR  no s3 or murmur or increase in P2, no edema   ABD:  soft and nontender with nl excursion in the supine position. No bruits or organomegaly, bowel sounds nl  MS:  warm without deformities, calf tenderness, cyanosis or clubbing  SKIN: warm and dry without lesions    NEURO:  alert, approp, no deficits         Assessment & Plan:

## 2011-11-13 NOTE — Patient Instructions (Addendum)
If cough worsens first thing to do is start prevacid 30 mg Take 30- 60 min before your first and last meals of the day and if not improving over several weeks (this was already called in to [facility] on 7/16

## 2011-11-14 ENCOUNTER — Other Ambulatory Visit: Payer: Self-pay

## 2011-11-14 DIAGNOSIS — O24419 Gestational diabetes mellitus in pregnancy, unspecified control: Secondary | ICD-10-CM

## 2011-11-20 ENCOUNTER — Institutional Professional Consult (permissible substitution): Payer: Self-pay | Admitting: Emergency Medicine

## 2011-11-23 ENCOUNTER — Telehealth: Payer: Self-pay | Admitting: Obstetrics and Gynecology

## 2011-11-23 ENCOUNTER — Ambulatory Visit (INDEPENDENT_AMBULATORY_CARE_PROVIDER_SITE_OTHER): Payer: 59 | Admitting: Obstetrics and Gynecology

## 2011-11-23 ENCOUNTER — Encounter: Payer: Self-pay | Admitting: Obstetrics and Gynecology

## 2011-11-23 ENCOUNTER — Encounter: Payer: 59 | Attending: Obstetrics and Gynecology | Admitting: Dietician

## 2011-11-23 ENCOUNTER — Ambulatory Visit (HOSPITAL_COMMUNITY)
Admission: RE | Admit: 2011-11-23 | Discharge: 2011-11-23 | Disposition: A | Discharge status: Home / Self Care | Payer: 59 | Source: Ambulatory Visit | Attending: Obstetrics and Gynecology | Admitting: Obstetrics and Gynecology

## 2011-11-23 ENCOUNTER — Other Ambulatory Visit: Payer: Self-pay | Admitting: Obstetrics and Gynecology

## 2011-11-23 VITALS — BP 110/64 | Wt 206.0 lb

## 2011-11-23 DIAGNOSIS — O09529 Supervision of elderly multigravida, unspecified trimester: Secondary | ICD-10-CM

## 2011-11-23 DIAGNOSIS — O24419 Gestational diabetes mellitus in pregnancy, unspecified control: Secondary | ICD-10-CM

## 2011-11-23 DIAGNOSIS — O99810 Abnormal glucose complicating pregnancy: Secondary | ICD-10-CM

## 2011-11-23 DIAGNOSIS — Z713 Dietary counseling and surveillance: Secondary | ICD-10-CM | POA: Insufficient documentation

## 2011-11-23 MED ORDER — KROGER LANCETS 21G MISC: Status: DC

## 2011-11-23 MED ORDER — GLUCOSE BLOOD VI STRP: ORAL_STRIP | Status: DC

## 2011-11-23 NOTE — ED Notes (Signed)
Diabetes Education: G3 P2 lady with EDD of 02/28/2012 seen for GDM counseling.  Ht: 65 in WT: 250.2 lb.   Currently at 31-32 we pregnancy.  Denies a history of GDM with previous pregnancies.  Her mother had Type 2 DM late in life.  Current medications:  Prenatal Vitamins and Tylenol as needed.  Denies any food or medication allergies.  Completed education session with assistance of the Arabic interpreter.  Has gained only 14 lb with this pregnancy.  1hr GTT was 239 mg/dl.  Completed review of the diet and monitoring schedule, carb counting, label reading, use of exhange portions.  I am hopeful that she will limit her bread and rice intake and add some more protein to the diet.  She complains of nausea and an aversion to meat and egg proteins at this time.  Provide in Arabic the handout, "Nutrition, Diabetes and Pregnancy".  Provided an Accu-Chek Smartview meter kit.  Instructed on self-blood glucose monitoring.  On return demonstration, her blood glucose levels was 101 at 4.5 hours after the first bite of her 10:30 AM meal. She was instructed to check the fasting and 2 hour post-meal glucose levels, record these and to take the meter and glucose log with her to all clinic appointments.  Instructed her husband who speaks [place], to contact the MD office for a prescription to be called in to her [facility] pharmacy.  She has my business card and her husband is to call for any issues.  Maggie May, RN, RD, CDE

## 2011-11-23 NOTE — Progress Notes (Signed)
Routine Prenatal Visit: [redacted]w[redacted]d Pt c/o of pain in her right leg.

## 2011-11-23 NOTE — Progress Notes (Signed)
Pt has [facility] appt today. U/S at [facility] Will need order for Rho Gam at [facility] pt sign VBAC at [facility] on same day that Rho Gam is given

## 2011-11-23 NOTE — Telephone Encounter (Signed)
Tc to pt per telephone call. Spoke with pt's husband. Rx for lancets and test strips for Accucheck Nano e-pres to pharm on file.

## 2011-11-23 NOTE — Telephone Encounter (Signed)
CHANDRA/RX °

## 2011-12-07 ENCOUNTER — Other Ambulatory Visit: Payer: Self-pay | Admitting: Obstetrics and Gynecology

## 2011-12-07 ENCOUNTER — Ambulatory Visit (INDEPENDENT_AMBULATORY_CARE_PROVIDER_SITE_OTHER): Payer: 59 | Admitting: Obstetrics and Gynecology

## 2011-12-07 ENCOUNTER — Encounter: Payer: Self-pay | Admitting: Obstetrics and Gynecology

## 2011-12-07 ENCOUNTER — Ambulatory Visit (INDEPENDENT_AMBULATORY_CARE_PROVIDER_SITE_OTHER): Payer: 59

## 2011-12-07 VITALS — BP 90/60 | Wt 208.0 lb

## 2011-12-07 DIAGNOSIS — O24419 Gestational diabetes mellitus in pregnancy, unspecified control: Secondary | ICD-10-CM

## 2011-12-07 DIAGNOSIS — O99810 Abnormal glucose complicating pregnancy: Secondary | ICD-10-CM

## 2011-12-07 NOTE — Patient Instructions (Signed)
RhoGAM When you are pregnant, you will have a blood test to find out what blood type you are. When a pregnant women is Rh-negative, it means that she does not have the Rh factor or antigen (a specific protein in red blood cells). The father of the baby will also be tested for his blood type. If he is Rh-negative also, the baby will be Rh-negative. In this case, no Rh problems will develop during the pregnancy, and RhoGAM will not be needed. If the father is Rh-positive, it is possible that the baby will be Rh-positive, which is incompatible with the mother's Rh-negative blood. In this case, the mother's blood will react as though she is allergic to the baby's blood. Her body will produce antibodies to destroy the baby's red blood cells. This causes anemia, brain damage, and even death of the baby. RhoGAM (anti-Rh, anti-D immunoglobulin) is a vaccine or immunization produced from human plasma. It is given to Rh-negative women who have Rh-positive babies, to protect the babies of future pregnancies. Firstborn infants are usually not affected, because it takes time for the mother's body to develop the antibodies. About 15% of people are Rh-negative. CAUSES  In a normal pregnancy, small numbers of the baby's red blood cells get into the mother's bloodstream. When the baby is Rh-positive, the mother's immune system (body system that fights infections and illness) recognizes that the blood is different from her own. The mother's body tries to fight it off and destroy it, like fighting off an infection or illness. The mother's immune system forms antibodies against the baby's blood. These antibodies are passed through the placenta to the baby, and they begin to destroy the baby's red blood cells. The baby becomes anemic (lacking enough red blood cells). The Rh problem does not usually affect the first baby. If the mother does not get RhoGAM, each of the following pregnancies gets more dangerous, if the future babies  are Rh-positive. That is because the mother's immune system develops more antibodies each time. It gets stronger and increases the number of antibodies against the baby's red blood cells with each future pregnancy. TREATMENT  RhoGAM is a solution containing small amounts of Rh antibodies. It prevents the development of antibodies against Rh-positive blood. It is injected into the mother at around 7 months of pregnancy, if she is Rh-negative. It is injected again within 72 hours after the baby is born, if the baby is Rh-positive. It is also given anytime during the pregnancy, if uterine bleeding develops. RhoGAM does not hurt the baby. It has few and minor side effects, if any, to the mother. A MICRhoGAM (smaller dose) is given in case of a miscarriage, elective abortion, or tubal pregnancy (pregnancy outside the uterus) if it is before 13 weeks of the pregnancy. RhoGAM should be given in the event of:  Miscarriage.   Threatened miscarriage, if there is bleeding.   Induced abortion.   Tubal (ectopic) pregnancy.   Any bleeding during the pregnancy.   Taking an amniotic fluid sample (amniocentesis).   Blood sampling.   Getting the wrong blood transfusion (positive blood in an Rh-negative woman).   Moving the baby from breech to normal position (external version).   Taking a placenta tissue sample (chorionic villus sampling).   Taking a blood sample from the umbilical cord (cordocentesis).   Mass of cysts in the uterus, instead of a fetus (hydatidiform mole).   Failure to get RhoGAM when necessary.   Pregnancy lasting past the due date, when  the last RhoGAM shot was given 12 weeks ago.   Injury (trauma) to the abdomen.  RhoGAM should be given even if the Rh-negative mother has a tubal ligation (female sterilization, "tied tubes"). This is because some women will later decide to have surgery to re-open the tubes, in order to get pregnant again. Or the tubal ligation might not  work. RhoGAM is given after the delivery of an Rh-positive baby to an Rh-negative mother. It only protects the baby in the next pregnancy. RhoGAM must be given after each delivery of an Rh-positive baby born to an Rh-negative mother. RhoGAM cannot help a pregnant Rh-negative mother if she has already been sensitized with Rh-positive antibodies. RhoGAM is not known to transmit hepatitis or other infectious diseases. Your caregiver can discuss the recommendations and uses of RhoGAM with you. You should not receive RhoGAM if you had an allergic reaction to it in the past. Document Released: 09/16/2001 Document Revised: 03/16/2011 Document Reviewed: 04/06/2009 [facility] Patient Information 2012 [place], [place].

## 2011-12-07 NOTE — Progress Notes (Signed)
[place] s+D   EFW 3 #.  cx 3.79 cm AFI 15.49 vtx BPP8/8 GDM pt with good blood sugar control A/P  Fetal kick counts reviewed All patients questions answered Return in two weeks Continue Prenatal vitamins Blood type O negative.  Pt to hospital to receive rhogam

## 2011-12-07 NOTE — Progress Notes (Signed)
Pt c/o pain in vaginal area when walking and when lying down.  FBS 82-97 PP 100-123

## 2011-12-15 ENCOUNTER — Inpatient Hospital Stay (HOSPITAL_COMMUNITY)
Admission: AD | Admit: 2011-12-15 | Discharge: 2011-12-15 | Disposition: A | Discharge status: Home / Self Care | Payer: 59 | Source: Ambulatory Visit | Attending: Obstetrics and Gynecology | Admitting: Obstetrics and Gynecology

## 2011-12-15 DIAGNOSIS — Z2989 Encounter for other specified prophylactic measures: Secondary | ICD-10-CM | POA: Insufficient documentation

## 2011-12-15 DIAGNOSIS — Z298 Encounter for other specified prophylactic measures: Secondary | ICD-10-CM | POA: Insufficient documentation

## 2011-12-15 MED ORDER — RHO D IMMUNE GLOBULIN 1500 UNIT/2ML IJ SOLN
300.0000 ug | Freq: Once | INTRAMUSCULAR | Status: AC
  Administered 2011-12-15: 300 ug via INTRAMUSCULAR
  Filled 2011-12-15: qty 2

## 2011-12-16 LAB — RH IG WORKUP (INCLUDES ABO/RH)
ABO/RH(D): O NEG
Fetal Screen: NEGATIVE
Gestational Age(Wks): 28

## 2011-12-22 ENCOUNTER — Ambulatory Visit (INDEPENDENT_AMBULATORY_CARE_PROVIDER_SITE_OTHER): Payer: 59 | Admitting: Obstetrics and Gynecology

## 2011-12-22 ENCOUNTER — Encounter: Payer: 59 | Admitting: Obstetrics and Gynecology

## 2011-12-22 VITALS — BP 100/60 | Wt 209.0 lb

## 2011-12-22 DIAGNOSIS — Z331 Pregnant state, incidental: Secondary | ICD-10-CM

## 2011-12-22 DIAGNOSIS — Z3490 Encounter for supervision of normal pregnancy, unspecified, unspecified trimester: Secondary | ICD-10-CM

## 2011-12-22 DIAGNOSIS — O99810 Abnormal glucose complicating pregnancy: Secondary | ICD-10-CM

## 2011-12-22 DIAGNOSIS — O24419 Gestational diabetes mellitus in pregnancy, unspecified control: Secondary | ICD-10-CM

## 2011-12-22 LAB — CBC
HCT: 34.3 % — ABNORMAL LOW (ref 36.0–46.0)
Hemoglobin: 11.7 g/dL — ABNORMAL LOW (ref 12.0–15.0)
MCHC: 34.1 g/dL (ref 30.0–36.0)
RBC: 3.85 MIL/uL — ABNORMAL LOW (ref 3.87–5.11)
WBC: 7.9 10*3/uL (ref 4.0–10.5)

## 2011-12-22 LAB — HEMOGLOBIN A1C
Hgb A1c MFr Bld: 5.5 % (ref <5.7)
Mean Plasma Glucose: 111 mg/dL (ref <117)

## 2011-12-22 NOTE — Progress Notes (Signed)
Pt has CBG book today

## 2011-12-22 NOTE — Progress Notes (Signed)
[redacted]w[redacted]d GDM - Glucose log - fasting 84 - 97                                   Post prandial - 104 - 107            Diet controlled  Feeling very tired. Labs: CBC and Hba1c today ROB x 2 weeks

## 2011-12-22 NOTE — Progress Notes (Signed)
Pt states she has no concerns today.  

## 2011-12-29 ENCOUNTER — Telehealth: Payer: Self-pay | Admitting: Obstetrics and Gynecology

## 2011-12-29 NOTE — Telephone Encounter (Signed)
TC from [facility]. Requested dental letter for pt which was faxed to [phone].

## 2012-01-05 ENCOUNTER — Ambulatory Visit (INDEPENDENT_AMBULATORY_CARE_PROVIDER_SITE_OTHER): Payer: 59 | Admitting: Obstetrics and Gynecology

## 2012-01-05 VITALS — BP 120/62 | Wt 212.0 lb

## 2012-01-05 DIAGNOSIS — O99810 Abnormal glucose complicating pregnancy: Secondary | ICD-10-CM

## 2012-01-05 DIAGNOSIS — O24419 Gestational diabetes mellitus in pregnancy, unspecified control: Secondary | ICD-10-CM

## 2012-01-05 DIAGNOSIS — K219 Gastro-esophageal reflux disease without esophagitis: Secondary | ICD-10-CM

## 2012-01-05 MED ORDER — PANTOPRAZOLE SODIUM 40 MG PO TBEC: 40.0000 mg | DELAYED_RELEASE_TABLET | Freq: Every day | ORAL | Status: DC

## 2012-01-05 NOTE — Progress Notes (Addendum)
[redacted]w[redacted]d Pt forgot log book today FKCs GDM on diet - reports fasting less than 90 and after meals less than 120 - encouraged to bring log NV EFW in 2wks for EFW secondary to GDM diet controlled.  If CBGs become elevated, rec antepartum testing. Rx protonix sent - requests refill for reflux Pt reports having had rhogam

## 2012-01-16 ENCOUNTER — Ambulatory Visit (INDEPENDENT_AMBULATORY_CARE_PROVIDER_SITE_OTHER): Payer: 59 | Admitting: Obstetrics and Gynecology

## 2012-01-16 ENCOUNTER — Ambulatory Visit (INDEPENDENT_AMBULATORY_CARE_PROVIDER_SITE_OTHER): Payer: 59

## 2012-01-16 VITALS — BP 100/64 | Wt 216.0 lb

## 2012-01-16 DIAGNOSIS — O99810 Abnormal glucose complicating pregnancy: Secondary | ICD-10-CM

## 2012-01-16 DIAGNOSIS — O24419 Gestational diabetes mellitus in pregnancy, unspecified control: Secondary | ICD-10-CM

## 2012-01-16 DIAGNOSIS — O09529 Supervision of elderly multigravida, unspecified trimester: Secondary | ICD-10-CM

## 2012-01-16 LAB — US OB FOLLOW UP

## 2012-01-16 NOTE — Progress Notes (Signed)
[redacted]w[redacted]d Ultrasound today estimated fetal weight 5 lbs. 13 oz. 81st percentile cervix 3.5 cm AFI of 15.2 cm Vertex anterior placenta All CBGs are good and GDM is diet controlled FKCs RTO 1wk

## 2012-01-23 ENCOUNTER — Encounter: Payer: 59 | Admitting: Obstetrics and Gynecology

## 2012-01-24 ENCOUNTER — Encounter: Payer: Self-pay | Admitting: Obstetrics and Gynecology

## 2012-01-24 ENCOUNTER — Ambulatory Visit (INDEPENDENT_AMBULATORY_CARE_PROVIDER_SITE_OTHER): Payer: 59 | Admitting: Obstetrics and Gynecology

## 2012-01-24 VITALS — BP 110/62 | Wt 215.0 lb

## 2012-01-24 DIAGNOSIS — O99810 Abnormal glucose complicating pregnancy: Secondary | ICD-10-CM

## 2012-01-24 DIAGNOSIS — O24419 Gestational diabetes mellitus in pregnancy, unspecified control: Secondary | ICD-10-CM

## 2012-01-24 NOTE — Progress Notes (Signed)
[redacted]w[redacted]d Pt states that she had not checked her glucose levels this week.  CBG in office today was 131 about 2 hours after sweet tea and cake. Pt verbalizes knowledge about her diet, but says she likes cake.  Reviewed importance of checking CBGs and normalizing CBGs.  She agrees to check FBS daily and at least one 2 hr pc next week prior to her next visit. Next U/S due first week in November.

## 2012-01-31 ENCOUNTER — Encounter: Payer: Self-pay | Admitting: Obstetrics and Gynecology

## 2012-01-31 ENCOUNTER — Ambulatory Visit (INDEPENDENT_AMBULATORY_CARE_PROVIDER_SITE_OTHER): Payer: 59 | Admitting: Obstetrics and Gynecology

## 2012-01-31 VITALS — BP 112/62 | Wt 216.0 lb

## 2012-01-31 DIAGNOSIS — O26899 Other specified pregnancy related conditions, unspecified trimester: Secondary | ICD-10-CM

## 2012-01-31 DIAGNOSIS — Z331 Pregnant state, incidental: Secondary | ICD-10-CM

## 2012-01-31 DIAGNOSIS — I499 Cardiac arrhythmia, unspecified: Secondary | ICD-10-CM

## 2012-01-31 DIAGNOSIS — N949 Unspecified condition associated with female genital organs and menstrual cycle: Secondary | ICD-10-CM

## 2012-01-31 DIAGNOSIS — O24419 Gestational diabetes mellitus in pregnancy, unspecified control: Secondary | ICD-10-CM

## 2012-01-31 DIAGNOSIS — O99810 Abnormal glucose complicating pregnancy: Secondary | ICD-10-CM

## 2012-01-31 LAB — POCT URINALYSIS DIPSTICK
Bilirubin, UA: NEGATIVE
Blood, UA: NEGATIVE
Glucose, UA: NEGATIVE
Ketones, UA: NEGATIVE
Spec Grav, UA: 1.015
Urobilinogen, UA: NEGATIVE

## 2012-01-31 MED ORDER — GLYBURIDE 2.5 MG PO TABS: 2.5000 mg | ORAL_TABLET | Freq: Every day | ORAL | Status: DC

## 2012-01-31 NOTE — Progress Notes (Signed)
FHR 176.  NST reactive  Desires cx check today. C/o pelvic pressure.  Urine C&S sent FBS 87-89 2HR [id number] Start glyburide 2.5 mg daily NST at NV

## 2012-01-31 NOTE — Progress Notes (Signed)
36w 1d

## 2012-02-02 LAB — URINE CULTURE
Colony Count: NO GROWTH
Organism ID, Bacteria: NO GROWTH

## 2012-02-05 ENCOUNTER — Ambulatory Visit (INDEPENDENT_AMBULATORY_CARE_PROVIDER_SITE_OTHER): Payer: 59

## 2012-02-05 ENCOUNTER — Ambulatory Visit (INDEPENDENT_AMBULATORY_CARE_PROVIDER_SITE_OTHER): Payer: 59 | Admitting: Obstetrics and Gynecology

## 2012-02-05 ENCOUNTER — Encounter: Payer: Self-pay | Admitting: Obstetrics and Gynecology

## 2012-02-05 VITALS — BP 88/70 | Wt 216.0 lb

## 2012-02-05 DIAGNOSIS — O09529 Supervision of elderly multigravida, unspecified trimester: Secondary | ICD-10-CM

## 2012-02-05 DIAGNOSIS — O289 Unspecified abnormal findings on antenatal screening of mother: Secondary | ICD-10-CM

## 2012-02-05 DIAGNOSIS — O24419 Gestational diabetes mellitus in pregnancy, unspecified control: Secondary | ICD-10-CM

## 2012-02-05 DIAGNOSIS — O99810 Abnormal glucose complicating pregnancy: Secondary | ICD-10-CM

## 2012-02-05 DIAGNOSIS — O288 Other abnormal findings on antenatal screening of mother: Secondary | ICD-10-CM

## 2012-02-05 NOTE — Progress Notes (Signed)
NST NONREACTIVE

## 2012-02-05 NOTE — Progress Notes (Signed)
  Ultrasound shows:  EFW not done        [place] EDD: 02/27/2012             AFI: 16.78 cm 60th%            Cervical length: not done             Placenta localization: anterior            Fetal presentation: cephalic    Anatomy survey completed yes    Anatomy survey is normal            Gender : female    Comments:BP 8/8. Patient started glyburide last week and fasting blood sugars are all less than 90.  2hour postprandial blood sugars are all <120. Chief complaint of feeling weak and she took her glyburide dinnertime last night.  Recommend no further glyburide today and start tomorrow with glyburide at the breakfast meal. Return in 1 week for BPP and visit

## 2012-02-05 NOTE — Progress Notes (Signed)
Pt vomited during first few minutes of NST.  Pulse 81. Repeat BP 92/62.Per DR  VPH, after review of NST, pt for BPP.  Pt still having nausea. Re

## 2012-02-12 ENCOUNTER — Ambulatory Visit (INDEPENDENT_AMBULATORY_CARE_PROVIDER_SITE_OTHER): Payer: 59 | Admitting: Obstetrics and Gynecology

## 2012-02-12 ENCOUNTER — Ambulatory Visit (INDEPENDENT_AMBULATORY_CARE_PROVIDER_SITE_OTHER): Payer: 59

## 2012-02-12 ENCOUNTER — Encounter: Payer: Self-pay | Admitting: Obstetrics and Gynecology

## 2012-02-12 VITALS — BP 96/62 | Wt 218.0 lb

## 2012-02-12 DIAGNOSIS — O99810 Abnormal glucose complicating pregnancy: Secondary | ICD-10-CM

## 2012-02-12 DIAGNOSIS — O24419 Gestational diabetes mellitus in pregnancy, unspecified control: Secondary | ICD-10-CM

## 2012-02-12 NOTE — Progress Notes (Signed)
[place] AFI 13.34c SIUP vtx with anterior placenta BPP 8/8 All blood sugars are normal [place] for growth at NV Induction at 39 weeks

## 2012-02-12 NOTE — Patient Instructions (Signed)
Labor Induction  Most women go into labor on their own between 37 and 42 weeks of the pregnancy. When this does not happen or when there is a medical need, medicine or other methods may be used to induce labor. Labor induction causes a pregnant woman's uterus to contract. It also causes the cervix to soften (ripen), open (dilate), and thin out (efface). Usually, labor is not induced before 39 weeks of the pregnancy unless there is a problem with the baby or mother. Whether your labor will be induced depends on a number of factors, including the following:  The medical condition of you and the baby.  How many weeks along you are.  The status of baby's lung maturity.  The condition of the cervix.  The position of the baby. REASONS FOR LABOR INDUCTION  The health of the baby or mother is at risk.  The pregnancy is overdue by 1 week or more.  The water breaks but labor does not start on its own.  The mother has a health condition or serious illness such as high blood pressure, infection, placental abruption, or diabetes.  The amniotic fluid amounts are low around the baby.  The baby is distressed. REASONS TO NOT INDUCE LABOR Labor induction may not be a good idea if:  It is shown that your baby does not tolerate labor.  An induction is just more convenient.  You want the baby to be born on a certain date, like a holiday.  You have had previous surgeries on your uterus, such as a myomectomy or the removal of fibroids.  Your placenta lies very low in the uterus and blocks the opening of the cervix (placenta previa).  Your baby is not in a head down position.  The umbilical cord drops down into the birth canal in front of the baby. This could cut off the baby's blood and oxygen supply.  You have had a previous cesarean delivery.  There areunusual circumstances, such as the baby being extremely premature. RISKS AND COMPLICATIONS Problems may occur in the process of induction  and plans may need to be modified as a situation unfolds. Some of the risks of induction include:  Change in fetal heart rate, such as too high, too low, or erratic.  Risk of fetal distress.  Risk of infection to mother and baby.  Increased chance of having a cesarean delivery.  The rare, but increased chance that the placenta will separate from the uterus (abruption).  Uterine rupture (very rare). When induction is needed for medical reasons, the benefits of induction may outweigh the risks. BEFORE THE PROCEDURE Your caregiver will check your cervix and the baby's position. This will help your caregiver decide if you are far enough along for an induction to work. PROCEDURE Several methods of labor induction may be used, such as:   Taking prostaglandin medicine to dilate and ripen the cervix. The medicine will also start contractions. It can be taken by mouth or by inserting a suppository into the vagina.  A thin tube (catheter) with a balloon on the end may be inserted into your vagina to dilate the cervix. Once inserted, the balloon expands with water, which causes the cervix to open.  Striping the membranes. Your caregiver inserts a finger between the cervix and membranes, which causes the cervix to be stretched and may cause the uterus to contract. This is often done during an office visit. You will be sent home to wait for the contractions to begin. You will   then come in for an induction.  Breaking the water. Your caregiver will make a hole in the amniotic sac using a small instrument. Once the amniotic sac breaks, contractions should begin. This may still take hours to see an effect.  Taking medicine to trigger or strengthen contractions. This medicine is given intravenously through a tube in your arm. All of the methods of induction, besides stripping the membranes, will be done in the hospital. Induction is done in the hospital so that you and the baby can be carefully  monitored. AFTER THE PROCEDURE Some inductions can take up to 2 or 3 days. Depending on the cervix, it usually takes less time. It takes longer when you are induced early in the pregnancy or if this is your first pregnancy. If a mother is still pregnant and the induction has been going on for 2 to 3 days, either the mother will be sent home or a cesarean delivery will be needed. Document Released: 08/16/2006 Document Revised: 06/19/2011 Document Reviewed: 01/30/2011 [employer] Patient Information 2013 [employer].  

## 2012-02-12 NOTE — Progress Notes (Signed)
Pt c/o back pain, unsure if she wants to have flu shot.

## 2012-02-15 ENCOUNTER — Telehealth: Payer: Self-pay | Admitting: Obstetrics and Gynecology

## 2012-02-15 NOTE — Telephone Encounter (Signed)
Induction scheduled for 02/20/12 @ 7:30pm with AR/CHS. -Adrianne Pridgen

## 2012-02-16 ENCOUNTER — Telehealth: Payer: Self-pay | Admitting: Obstetrics and Gynecology

## 2012-02-16 ENCOUNTER — Telehealth (HOSPITAL_COMMUNITY): Payer: Self-pay | Admitting: *Deleted

## 2012-02-16 NOTE — Telephone Encounter (Signed)
Induction scheduled for 02/23/12 @ 7:30pm with ND/MK. -Adrianne Pridgen

## 2012-02-16 NOTE — Telephone Encounter (Signed)
Induction scheduled 02/22/12 at 7:30 pm with AVS/VL. -Adrianne Pridgen

## 2012-02-16 NOTE — Telephone Encounter (Signed)
Preadmission screen Interpreter number [phone] pt voiced questions regarding induction appt reluctance to do preadmission enc pt to call MD office to clarify appt plans

## 2012-02-16 NOTE — Telephone Encounter (Signed)
Preadmission screen  

## 2012-02-18 ENCOUNTER — Inpatient Hospital Stay (HOSPITAL_COMMUNITY)
Admission: AD | Admit: 2012-02-18 | Discharge: 2012-02-21 | DRG: 775 | Disposition: A | Discharge status: Home / Self Care | Payer: 59 | Source: Ambulatory Visit | Attending: Obstetrics and Gynecology | Admitting: Obstetrics and Gynecology

## 2012-02-18 ENCOUNTER — Encounter (HOSPITAL_COMMUNITY): Payer: Self-pay | Admitting: *Deleted

## 2012-02-18 DIAGNOSIS — O4290 Premature rupture of membranes, unspecified as to length of time between rupture and onset of labor, unspecified weeks of gestation: Principal | ICD-10-CM | POA: Diagnosis present

## 2012-02-18 DIAGNOSIS — O09529 Supervision of elderly multigravida, unspecified trimester: Secondary | ICD-10-CM | POA: Diagnosis present

## 2012-02-18 DIAGNOSIS — O99814 Abnormal glucose complicating childbirth: Secondary | ICD-10-CM | POA: Diagnosis present

## 2012-02-18 DIAGNOSIS — O219 Vomiting of pregnancy, unspecified: Secondary | ICD-10-CM

## 2012-02-18 DIAGNOSIS — O9903 Anemia complicating the puerperium: Secondary | ICD-10-CM | POA: Diagnosis not present

## 2012-02-18 DIAGNOSIS — O99892 Other specified diseases and conditions complicating childbirth: Secondary | ICD-10-CM | POA: Diagnosis present

## 2012-02-18 DIAGNOSIS — D649 Anemia, unspecified: Secondary | ICD-10-CM | POA: Diagnosis not present

## 2012-02-18 DIAGNOSIS — Z8632 Personal history of gestational diabetes: Secondary | ICD-10-CM

## 2012-02-18 DIAGNOSIS — O24419 Gestational diabetes mellitus in pregnancy, unspecified control: Secondary | ICD-10-CM

## 2012-02-18 DIAGNOSIS — N90810 Female genital mutilation status, unspecified: Secondary | ICD-10-CM | POA: Diagnosis present

## 2012-02-18 DIAGNOSIS — O99891 Other specified diseases and conditions complicating pregnancy: Secondary | ICD-10-CM | POA: Diagnosis present

## 2012-02-18 HISTORY — DX: Gestational diabetes mellitus in pregnancy, unspecified control: O24.419

## 2012-02-18 HISTORY — DX: Type O blood, Rh negative: Z67.41

## 2012-02-18 LAB — AMNISURE RUPTURE OF MEMBRANE (ROM) NOT AT ARMC: Amnisure ROM: POSITIVE

## 2012-02-18 LAB — CBC
Platelets: 217 10*3/uL (ref 150–400)
RBC: 3.56 MIL/uL — ABNORMAL LOW (ref 3.87–5.11)
WBC: 6.1 10*3/uL (ref 4.0–10.5)

## 2012-02-18 MED ORDER — EPHEDRINE 5 MG/ML INJ
10.0000 mg | INTRAVENOUS | Status: DC | PRN
  Filled 2012-02-18: qty 4

## 2012-02-18 MED ORDER — OXYTOCIN BOLUS FROM INFUSION: 500.0000 mL | INTRAVENOUS | Status: DC

## 2012-02-18 MED ORDER — OXYTOCIN 40 UNITS IN LACTATED RINGERS INFUSION - SIMPLE MED: 62.5000 mL/h | INTRAVENOUS | Status: DC

## 2012-02-18 MED ORDER — LACTATED RINGERS IV SOLN
500.0000 mL | INTRAVENOUS | Status: DC | PRN
  Administered 2012-02-18 (×2): 1000 mL via INTRAVENOUS

## 2012-02-18 MED ORDER — IBUPROFEN 600 MG PO TABS
600.0000 mg | ORAL_TABLET | Freq: Four times a day (QID) | ORAL | Status: DC | PRN
  Administered 2012-02-19: 600 mg via ORAL
  Filled 2012-02-18: qty 1

## 2012-02-18 MED ORDER — ONDANSETRON HCL 4 MG/2ML IJ SOLN: 4.0000 mg | Freq: Four times a day (QID) | INTRAMUSCULAR | Status: DC | PRN

## 2012-02-18 MED ORDER — SODIUM CHLORIDE 0.9 % IV SOLN: 250.0000 mL | INTRAVENOUS | Status: DC | PRN

## 2012-02-18 MED ORDER — ACETAMINOPHEN 325 MG PO TABS: 650.0000 mg | ORAL_TABLET | ORAL | Status: DC | PRN

## 2012-02-18 MED ORDER — ZOLPIDEM TARTRATE 5 MG PO TABS: 5.0000 mg | ORAL_TABLET | Freq: Every evening | ORAL | Status: DC | PRN

## 2012-02-18 MED ORDER — DIPHENHYDRAMINE HCL 50 MG/ML IJ SOLN: 12.5000 mg | INTRAMUSCULAR | Status: DC | PRN

## 2012-02-18 MED ORDER — SODIUM CHLORIDE 0.9 % IJ SOLN: 3.0000 mL | INTRAMUSCULAR | Status: DC | PRN

## 2012-02-18 MED ORDER — SODIUM CHLORIDE 0.9 % IJ SOLN: 3.0000 mL | Freq: Two times a day (BID) | INTRAMUSCULAR | Status: DC

## 2012-02-18 MED ORDER — OXYCODONE-ACETAMINOPHEN 5-325 MG PO TABS
1.0000 | ORAL_TABLET | ORAL | Status: DC | PRN
  Administered 2012-02-19: 1 via ORAL
  Filled 2012-02-18: qty 1

## 2012-02-18 MED ORDER — HYDROXYZINE HCL 50 MG PO TABS
50.0000 mg | ORAL_TABLET | Freq: Four times a day (QID) | ORAL | Status: DC | PRN
  Administered 2012-02-19: 50 mg via ORAL
  Filled 2012-02-18: qty 1

## 2012-02-18 MED ORDER — PHENYLEPHRINE 40 MCG/ML (10ML) SYRINGE FOR IV PUSH (FOR BLOOD PRESSURE SUPPORT)
80.0000 ug | PREFILLED_SYRINGE | INTRAVENOUS | Status: DC | PRN
  Filled 2012-02-18: qty 5

## 2012-02-18 MED ORDER — LIDOCAINE HCL (PF) 1 % IJ SOLN
30.0000 mL | INTRAMUSCULAR | Status: DC | PRN
  Administered 2012-02-19: 30 mL via SUBCUTANEOUS
  Filled 2012-02-18: qty 30

## 2012-02-18 MED ORDER — PHENYLEPHRINE 40 MCG/ML (10ML) SYRINGE FOR IV PUSH (FOR BLOOD PRESSURE SUPPORT): 80.0000 ug | PREFILLED_SYRINGE | INTRAVENOUS | Status: DC | PRN

## 2012-02-18 MED ORDER — EPHEDRINE 5 MG/ML INJ: 10.0000 mg | INTRAVENOUS | Status: DC | PRN

## 2012-02-18 MED ORDER — HYDROXYZINE HCL 50 MG/ML IM SOLN
50.0000 mg | Freq: Four times a day (QID) | INTRAMUSCULAR | Status: DC | PRN
  Filled 2012-02-18: qty 1

## 2012-02-18 MED ORDER — FENTANYL CITRATE 0.05 MG/ML IJ SOLN
100.0000 ug | INTRAMUSCULAR | Status: DC | PRN
  Administered 2012-02-19 (×2): 100 ug via INTRAVENOUS
  Filled 2012-02-18 (×3): qty 2

## 2012-02-18 MED ORDER — LACTATED RINGERS IV SOLN
500.0000 mL | Freq: Once | INTRAVENOUS | Status: AC
  Administered 2012-02-19: 08:00:00 via INTRAVENOUS

## 2012-02-18 MED ORDER — FENTANYL 2.5 MCG/ML BUPIVACAINE 1/10 % EPIDURAL INFUSION (WH - ANES)
14.0000 mL/h | INTRAMUSCULAR | Status: DC
  Administered 2012-02-19: 14 mL/h via EPIDURAL
  Filled 2012-02-18: qty 125

## 2012-02-18 MED ORDER — OXYTOCIN 40 UNITS IN LACTATED RINGERS INFUSION - SIMPLE MED
1.0000 m[IU]/min | INTRAVENOUS | Status: DC
  Administered 2012-02-19: 2 m[IU]/min via INTRAVENOUS
  Filled 2012-02-18: qty 1000

## 2012-02-18 MED ORDER — TERBUTALINE SULFATE 1 MG/ML IJ SOLN: 0.2500 mg | Freq: Once | INTRAMUSCULAR | Status: AC | PRN

## 2012-02-18 MED ORDER — CITRIC ACID-SODIUM CITRATE 334-500 MG/5ML PO SOLN: 30.0000 mL | ORAL | Status: DC | PRN

## 2012-02-18 NOTE — MAU Note (Deleted)
Pt reports that her toe has been hurting for 1 month and stomach 3 weeks.

## 2012-02-18 NOTE — MAU Note (Signed)
Patient states she had some leaking of yellow/red fluid about 15 minutes ago. Not actively leaking at this time. Having some mild contractions and reports good fetal movement.

## 2012-02-18 NOTE — MAU Note (Signed)
Pt reports leaking of fluid @ 1715 fluid that was yellow and red

## 2012-02-19 ENCOUNTER — Other Ambulatory Visit: Payer: 59

## 2012-02-19 ENCOUNTER — Encounter (HOSPITAL_COMMUNITY): Payer: Self-pay | Admitting: Anesthesiology

## 2012-02-19 ENCOUNTER — Encounter (HOSPITAL_COMMUNITY): Payer: Self-pay

## 2012-02-19 ENCOUNTER — Inpatient Hospital Stay (HOSPITAL_COMMUNITY): Payer: 59 | Admitting: Anesthesiology

## 2012-02-19 ENCOUNTER — Encounter: Payer: 59 | Admitting: Obstetrics and Gynecology

## 2012-02-19 DIAGNOSIS — Z6741 Type O blood, Rh negative: Secondary | ICD-10-CM | POA: Insufficient documentation

## 2012-02-19 HISTORY — DX: Type O blood, Rh negative: Z67.41

## 2012-02-19 LAB — GLUCOSE, CAPILLARY
Glucose-Capillary: 75 mg/dL (ref 70–99)
Glucose-Capillary: 74 mg/dL (ref 70–99)
Glucose-Capillary: 80 mg/dL (ref 70–99)

## 2012-02-19 LAB — TYPE AND SCREEN: Antibody Screen: POSITIVE

## 2012-02-19 LAB — RPR: RPR Ser Ql: NONREACTIVE

## 2012-02-19 MED ORDER — PRENATAL MULTIVITAMIN CH
1.0000 | ORAL_TABLET | Freq: Every day | ORAL | Status: DC
  Administered 2012-02-19 – 2012-02-21 (×3): 1 via ORAL
  Filled 2012-02-19 (×3): qty 1

## 2012-02-19 MED ORDER — LIDOCAINE HCL (PF) 1 % IJ SOLN
INTRAMUSCULAR | Status: DC | PRN
  Administered 2012-02-19 (×2): 2 mL

## 2012-02-19 MED ORDER — ZOLPIDEM TARTRATE 5 MG PO TABS: 5.0000 mg | ORAL_TABLET | Freq: Every evening | ORAL | Status: DC | PRN

## 2012-02-19 MED ORDER — WITCH HAZEL-GLYCERIN EX PADS: 1.0000 "application " | MEDICATED_PAD | CUTANEOUS | Status: DC | PRN

## 2012-02-19 MED ORDER — SENNOSIDES-DOCUSATE SODIUM 8.6-50 MG PO TABS
2.0000 | ORAL_TABLET | Freq: Every day | ORAL | Status: DC
  Administered 2012-02-19 – 2012-02-20 (×2): 2 via ORAL

## 2012-02-19 MED ORDER — DIBUCAINE 1 % RE OINT: 1.0000 "application " | TOPICAL_OINTMENT | RECTAL | Status: DC | PRN

## 2012-02-19 MED ORDER — IBUPROFEN 600 MG PO TABS
600.0000 mg | ORAL_TABLET | Freq: Four times a day (QID) | ORAL | Status: DC
  Administered 2012-02-19 – 2012-02-21 (×6): 600 mg via ORAL
  Filled 2012-02-19 (×7): qty 1

## 2012-02-19 MED ORDER — SIMETHICONE 80 MG PO CHEW: 80.0000 mg | CHEWABLE_TABLET | ORAL | Status: DC | PRN

## 2012-02-19 MED ORDER — ONDANSETRON HCL 4 MG PO TABS: 4.0000 mg | ORAL_TABLET | ORAL | Status: DC | PRN

## 2012-02-19 MED ORDER — DIPHENHYDRAMINE HCL 25 MG PO CAPS: 25.0000 mg | ORAL_CAPSULE | Freq: Four times a day (QID) | ORAL | Status: DC | PRN

## 2012-02-19 MED ORDER — ONDANSETRON HCL 4 MG/2ML IJ SOLN: 4.0000 mg | INTRAMUSCULAR | Status: DC | PRN

## 2012-02-19 MED ORDER — LANOLIN HYDROUS EX OINT: TOPICAL_OINTMENT | CUTANEOUS | Status: DC | PRN

## 2012-02-19 MED ORDER — TETANUS-DIPHTH-ACELL PERTUSSIS 5-2.5-18.5 LF-MCG/0.5 IM SUSP
0.5000 mL | Freq: Once | INTRAMUSCULAR | Status: AC
  Administered 2012-02-20: 0.5 mL via INTRAMUSCULAR
  Filled 2012-02-19: qty 0.5

## 2012-02-19 MED ORDER — OXYCODONE-ACETAMINOPHEN 5-325 MG PO TABS
1.0000 | ORAL_TABLET | ORAL | Status: DC | PRN
  Administered 2012-02-21: 1 via ORAL
  Filled 2012-02-19: qty 1

## 2012-02-19 MED ORDER — BENZOCAINE-MENTHOL 20-0.5 % EX AERO
1.0000 "application " | INHALATION_SPRAY | CUTANEOUS | Status: DC | PRN
  Administered 2012-02-19: 1 via TOPICAL
  Filled 2012-02-19: qty 56

## 2012-02-19 NOTE — H&P (Signed)
Jessica Cervantes is a 41 y.o. female presenting for unannounced w/ CC of LOF since around 1715. Pt denies any VB, UTI, or PIH s/s.  No resp or GI c/o's.  Accompanied by her husband.  GFM.  Hasn't taken her Glyburide today, or checked sugar.  Reports stomach pains for 3 weeks.   Prenatal course: Onset of care at 11weeks. NOB w/u at 14 weeks w/ lingering n/v and GERD; Rx'd Protonix at that time.  Suspected bug bite on Lt breast at 15 weeks, and trx'd w/ Keflex.  C/o persistent cough around 17 weeks, which last for about a month, despite therapeutic measures.  Normal anatomy scan at [redacted]w[redacted]d.  One hr gtt=239, so [facility].  GDM diet controlled until 36 weeks when started on Glyburide 2.5mg  po qd; some diet non-compliance around that time.  Growth u/s WNL, w/ EFW at 34 weeks=81%.  Pt has received antenatal testing for GDM.  Pt did receive Rhophylac during the pregnancy.    OB Hx: G1=SVD '07 at 40 weeks, female=7lb G2=SVD '10 at [redacted]w[redacted]d, female=6+12 G3=current .Marland Kitchen Patient Active Problem List  Diagnosis  . Language Barrier (Arabic)  . H/O malaria  . AMA (advanced maternal age) multigravida 35+  . Hx of gestational diabetes in prior pregnancy, currently pregnant  . Reflux  . Nausea and vomiting in pregnancy  . Cough, persistent  . Gestational diabetes  . Premature rupture of membranes  . Type o blood, rh negative    Maternal Medical History:  Reason for admission: Reason for admission: rupture of membranes.  Contractions: Frequency: rare.    Fetal activity: Perceived fetal activity is normal.   Last perceived fetal movement was within the past hour.    Prenatal Complications - Diabetes: gestational. Diabetes is managed by oral agent (monotherapy).      OB History    Grav Para Term Preterm Abortions TAB SAB Ect Mult Living   3 2 2       2      Past Medical History  Diagnosis Date  . H/O constipation 04/07/09  . H/O hemorrhoids 09/02/09  . FH: diabetes  mellitus   . H/O malaria   . History of anemia   . Female circumcision   . GBS carrier   . AMA (advanced maternal age) multigravida 35+   . Rh negative, maternal   . Anemia   . H/O malaria   . Gestational diabetes    Past Surgical History  Procedure Date  . Gallbladder surgery 2010   Family History: family history includes Diabetes in her mother. Social History:  reports that she has never smoked. She has never used smokeless tobacco. She reports that she does not drink alcohol or use illicit drugs.   Prenatal Transfer Tool  Maternal Diabetes: Yes:  Diabetes Type:  Insulin/Medication controlled Genetic Screening: Declined Maternal Ultrasounds/Referrals: Normal Fetal Ultrasounds or other Referrals:  None Maternal Substance Abuse:  No Significant Maternal Medications:  Meds include: Other:  Significant Maternal Lab Results:  Lab values include: Group B Strep negative Other Comments:  Glyburide for GDM;   Review of Systems  Constitutional: Negative.   HENT: Negative.   Eyes: Negative.   Respiratory: Negative.   Cardiovascular: Negative.   Gastrointestinal: Negative.   Genitourinary: Negative.   Skin: Negative.   Neurological: Negative.     Dilation: 1.5 Effacement (%): 60 Exam by:: H Izzabella Besse Blood pressure 90/57, pulse 81, temperature 98.1 F (36.7 C), temperature source Oral, resp. rate 18, height  5\' 5"  (1.651 m), weight 218 lb (98.884 kg), last menstrual period 05/23/2011, SpO2 100.00%. .. Filed Vitals:   02/18/12 1738 02/18/12 2100 02/18/12 2104 02/18/12 2246  BP: 108/74  90/57 93/43  Pulse: 106  81 62  Temp: 97.8 F (36.6 C) 98.1 F (36.7 C)    TempSrc: Oral Oral    Resp: 18 18 18    Height:  5\' 5"  (1.651 m)    Weight:  218 lb (98.884 kg)    SpO2: 100%      Maternal Exam:  Uterine Assessment: Contraction strength is mild.  Contraction frequency is rare.   Abdomen: Fetal presentation: vertex  Introitus: Ferning test: not done.  Nitrazine test: not  done. Amniotic fluid character: meconium stained.  Pelvis: adequate for delivery.   Cervix: Cervix evaluated by digital exam.     Fetal Exam Fetal Monitor Review: Mode: ultrasound.   Baseline rate: 140.  Variability: moderate (6-25 bpm).   Pattern: accelerations present and no decelerations.    Fetal State Assessment: Category I - tracings are normal.     Physical Exam  Constitutional: She is oriented to person, place, and time. She appears well-developed and well-nourished. No distress.  HENT:  Head: Normocephalic and atraumatic.       Has headwrap on (Muslim)  Eyes: Pupils are equal, round, and reactive to light.  Cardiovascular: Normal rate.   Respiratory: Effort normal.  GI: Soft.       gravid  Neurological: She is alert and oriented to person, place, and time. She has normal reflexes.  Skin: Skin is warm and dry.       Henna ink on hands, feet bilaterally  Psychiatric: She has a normal mood and affect. Her behavior is normal. Judgment and thought content normal.    Prenatal labs: ABO, Rh: --/--/O NEG (09/06 1213) Antibody: NEG (09/06 1213) Rubella: 394.4 (05/01 1115) RPR: NON REAC (05/01 1115)  HBsAg: NEGATIVE (05/01 1115)  HIV: NON REACTIVE (05/01 1115)  GBS: NEGATIVE (10/23 1345)   Assessment/Plan: 1.  [redacted]w[redacted]d 2.  PROM at 1715, 02/18/12 3. MSF  4.  Cat I FHT 5. GDM-Glyburide 6.  Language barrier 7. H/o female circumcision 8. GBS neg  1.  Admit to [facility] per c/w Dr. Pennie Rushing 2. Routine L&D orders, cbg's q 2hr 3.  Per c/w Dr. Pennie Rushing, if need for induction, she rec'd Pitocin secondary to multip status 4.  Support as needed in labor; pt declines epidural as first choice 5.  C/w MD prn  Madia Carvell H 02/19/2012, 12:09 AM

## 2012-02-19 NOTE — Anesthesia Procedure Notes (Signed)
Epidural Patient location during procedure: OB Start time: 02/19/2012 8:04 AM  Staffing Anesthesiologist: Brayton Caves R Performed by: anesthesiologist   Preanesthetic Checklist Completed: patient identified, site marked, surgical consent, pre-op evaluation, timeout performed, IV checked, risks and benefits discussed and monitors and equipment checked  Epidural Patient position: sitting Prep: site prepped and draped and DuraPrep Patient monitoring: continuous pulse ox and blood pressure Approach: midline Injection technique: LOR air and LOR saline  Needle:  Needle type: Tuohy  Needle gauge: 17 G Needle length: 9 cm and 9 Needle insertion depth: 5 cm cm Catheter type: closed end flexible Catheter size: 19 Gauge Catheter at skin depth: 10 cm Test dose: negative  Assessment Events: blood not aspirated, injection not painful, no injection resistance, negative IV test and no paresthesia  Additional Notes Patient identified.  Risk benefits discussed including failed block, incomplete pain control, headache, nerve damage, paralysis, blood pressure changes, nausea, vomiting, reactions to medication both toxic or allergic, and postpartum back pain.  Patient expressed understanding and wished to proceed.  All questions were answered.  Sterile technique used throughout procedure and epidural site dressed with sterile barrier dressing. No paresthesia or other complications noted.The patient did not experience any signs of intravascular injection such as tinnitus or metallic taste in mouth nor signs of intrathecal spread such as rapid motor block. Please see nursing notes for vital signs.

## 2012-02-19 NOTE — Progress Notes (Addendum)
  Subjective: Requesting another dose of pain medication.  Crying out with contractions.  Received Fentanyl at 5:32a with minimal relief.  Husband at bedside as interpreter.  Objective: BP 100/61  Pulse 95  Temp 98 F (36.7 C) (Oral)  Resp 20  Ht 5\' 5"  (1.651 m)  Wt 218 lb (98.884 kg)  BMI 36.28 kg/m2  SpO2 100%  LMP 05/23/2011      FHT: Category 1 UC:   regular, every 5 minutes Pitocin on 9 mu/min SVE:   Dilation: 3 Effacement (%): 80 Station: -2 Exam by:: Eustace Pen, CNM  Assessment / Plan: Early labor Desires epidural--will proceed with placement.  Nigel Bridgeman 02/19/2012, 7:40 AM

## 2012-02-19 NOTE — Progress Notes (Signed)
Subjective: Ctxs more painful now.  Pitocin on 5mu.  Pt sleeps in between, but grimace and tense during.  Husband resting still at bedside.   Last cbg not recorded, but RN reported in 47s and gave pt additional juice.  Objective: BP 96/52  Pulse 69  Temp 98 F (36.7 C) (Oral)  Resp 18  Ht 5\' 5"  (1.651 m)  Wt 218 lb (98.884 kg)  BMI 36.28 kg/m2  SpO2 100%  LMP 05/23/2011      FHT:  FHR: 145 bpm, variability: moderate,  accelerations:  Present,  decelerations:  Absent UC:   irregular, every 5-7 minutes SVE:   1.5/80/-2, posterior, soft  Labs: Lab Results  Component Value Date   WBC 6.1 02/18/2012   HGB 10.2* 02/18/2012   HCT 30.7* 02/18/2012   MCV 86.2 02/18/2012   PLT 217 02/18/2012    Assessment / Plan: 1. [redacted]w[redacted]d 2. PROM 3. GBS neg 4. MSF 5. GDM-Glyburide  Labor: Progressing on Pitocin, will continue to increase then AROM Preeclampsia:  no signs or symptoms of toxicity Fetal Wellbeing:  Category I Pain Control:  Fentanyl I/D:  n/a Anticipated MOD:  NSVD 1. Support as needed. 2. C/w MD prn  Alece Koppel H 02/19/2012, 3:15 AM

## 2012-02-19 NOTE — Anesthesia Preprocedure Evaluation (Signed)
Anesthesia Evaluation  Patient identified by MRN, date of birth, ID band Patient awake    Reviewed: Allergy & Precautions, H&P , Patient's Chart, lab work & pertinent test results  Airway Mallampati: III TM Distance: >3 FB Neck ROM: full    Dental No notable dental hx.    Pulmonary neg pulmonary ROS,  breath sounds clear to auscultation  Pulmonary exam normal       Cardiovascular negative cardio ROS  Rhythm:regular Rate:Normal     Neuro/Psych negative neurological ROS  negative psych ROS   GI/Hepatic negative GI ROS, Neg liver ROS,   Endo/Other  negative endocrine ROSdiabetesMorbid obesity  Renal/GU negative Renal ROS     Musculoskeletal   Abdominal   Peds  Hematology negative hematology ROS (+)   Anesthesia Other Findings H/O constipation 04/07/09   H/O hemorrhoids 09/02/09      FH: diabetes mellitus     H/O malaria        History of anemia     Female circumcision        GBS carrier     AMA (advanced maternal age) multigravida 35+        Rh negative, maternal     Anemia        H/O malaria     Gestational diabetes        Type o blood, rh negative 02/19/2012    Reproductive/Obstetrics (+) Pregnancy                           Anesthesia Physical Anesthesia Plan  ASA: III  Anesthesia Plan: Epidural   Post-op Pain Management:    Induction:   Airway Management Planned:   Additional Equipment:   Intra-op Plan:   Post-operative Plan:   Informed Consent: I have reviewed the patients History and Physical, chart, labs and discussed the procedure including the risks, benefits and alternatives for the proposed anesthesia with the patient or authorized representative who has indicated his/her understanding and acceptance.     Plan Discussed with:   Anesthesia Plan Comments:         Anesthesia Quick Evaluation

## 2012-02-19 NOTE — Progress Notes (Signed)
Jessica Cervantes is a 41 y.o. [id number] at [redacted]w[redacted]d admitted for rupture of membranes  Subjective: Pt resting on her Lt side on my arrival to room.  Husband resting on couch at beside.  RN reported pt's cbg low, so given juice.  Will CTO closely.  No VB.  LOF remains meconium-stained.  Objective: BP 90/57  Pulse 81  Temp 98.1 F (36.7 C) (Oral)  Resp 18  Ht 5\' 5"  (1.651 m)  Wt 218 lb (98.884 kg)  BMI 36.28 kg/m2  SpO2 100%  LMP 05/23/2011      FHT:  FHR: 140 bpm, variability: moderate,  accelerations:  Present,  decelerations:  Absent UC:   irregular, every 6-12 minutes SVE:   Dilation: 1.5 Effacement (%): 60 Exam by:: H Maryam Feely  Labs: Lab Results  Component Value Date   WBC 6.1 02/18/2012   HGB 10.2* 02/18/2012   HCT 30.7* 02/18/2012   MCV 86.2 02/18/2012   PLT 217 02/18/2012   .Marland Kitchen CBG (last 3)   Basename 02/18/12 2213  GLUCAP 63*     Assessment / Plan: 1. [redacted]w[redacted]d 2. Prelabor ROM 02/18/12 at 1730 3.  GDM-Glyburide 4. GBS neg 5. MSF  Labor: minimal change in ctx pattern since admission Preeclampsia:  no signs or symptoms of toxicity Fetal Wellbeing:  Category I Pain Control:  Labor support without medications I/D:  n/a Anticipated MOD:  NSVD 1.  Rec'd Pitocin per low-dose protocol since no increase in ctx intensity, and very little change in frequency.  R/b/a rev'd, and pt agreeable to proceed. 2.  C/w MD prn  Larinda Herter H 02/19/2012, 12:03 AM

## 2012-02-20 ENCOUNTER — Inpatient Hospital Stay (HOSPITAL_COMMUNITY): Admission: RE | Admit: 2012-02-20 | Payer: 59 | Source: Ambulatory Visit

## 2012-02-20 LAB — CBC
HCT: 29.7 % — ABNORMAL LOW (ref 36.0–46.0)
MCH: 28.5 pg (ref 26.0–34.0)
MCV: 87.4 fL (ref 78.0–100.0)
Platelets: 208 10*3/uL (ref 150–400)
RBC: 3.4 MIL/uL — ABNORMAL LOW (ref 3.87–5.11)

## 2012-02-20 LAB — GLUCOSE, CAPILLARY
Glucose-Capillary: 86 mg/dL (ref 70–99)
Glucose-Capillary: 108 mg/dL — ABNORMAL HIGH (ref 70–99)

## 2012-02-20 NOTE — Anesthesia Postprocedure Evaluation (Signed)
  Anesthesia Post-op Note  Patient: Jessica Cervantes  Procedure(s) Performed: * No procedures listed *  Patient Location: Mother/Baby  Anesthesia Type:Epidural  Level of Consciousness: awake, alert  and oriented  Airway and Oxygen Therapy: Patient Spontanous Breathing  Post-op Pain: none  Post-op Assessment: Post-op Vital signs reviewed, Patient's Cardiovascular Status Stable, Pain level not controlled, No headache, No backache and No residual numbness  Post-op Vital Signs: Reviewed and stable  Complications: No apparent anesthesia complications

## 2012-02-20 NOTE — Progress Notes (Signed)
S: comfortable, little bleeding, slept some     Breastfeeding, worried hemorrhoid will hurt with bm not pain now requests stool softner O BP 108/71  Pulse 76  Temp 98 F (36.7 C) (Oral)  Resp 18  Ht 5\' 5"  (1.651 m)  Wt 98.884 kg (218 lb)  BMI 36.28 kg/m2  SpO2 99%  LMP 05/23/2011  Breastfeeding? Unknown     abd soft, nt, ff      sm  Flow perineum with MLE and lac of female circumcision well approximated no redness, edema, or drainage     -Homans sign bilaterally,       +1 Edema lower legs    Component Value Date/Time   HGB 9.7* 02/20/2012 0515   HCT 29.7* 02/20/2012 0515   A normal involution     Lactating     PP day 1     anemia P encouraged ambulation, water, frequent voids, anemia and iron/diet discussed, continue care Lavera Guise, CNM

## 2012-02-21 MED ORDER — FERRALET 90 90-1 MG PO TABS: 1.0000 | ORAL_TABLET | Freq: Every day | ORAL | Status: DC

## 2012-02-21 MED ORDER — IBUPROFEN 600 MG PO TABS: 600.0000 mg | ORAL_TABLET | Freq: Four times a day (QID) | ORAL | Status: DC

## 2012-02-21 MED ORDER — OXYCODONE-ACETAMINOPHEN 5-325 MG PO TABS: 1.0000 | ORAL_TABLET | ORAL | Status: DC | PRN

## 2012-02-21 NOTE — Discharge Summary (Signed)
Obstetric Discharge Summary Reason for Admission: rupture of membranes Prenatal Procedures: ultrasound Intrapartum Procedures: spontaneous vaginal delivery Postpartum Procedures: Rho(D) Ig Complications-Operative and Postpartum: MLE repaired, 1st degree laceration above clitorectomy and mild anemia   Hemoglobin  Date Value Range Status  02/20/2012 9.7* 12.0 - 15.0 g/dL Final     HCT  Date Value Range Status  02/20/2012 29.7* 36.0 - 46.0 % Final    Physical Exam:  General: alert and no distress Lochia: appropriate Uterine Fundus: firm Incision: healing well DVT Evaluation: No evidence of DVT seen on physical exam. Negative Homan's sign. Calf/Ankle edema is present.  Discharge Diagnoses: Term Pregnancy-delivered  Discharge Information: Date: 02/21/2012 Activity: pelvic rest Diet: routine and iron-rich Medications: PNV, Ibuprofen, Iron and Percocet Condition: stable Instructions: refer to printed discharge instructions Discharge to: home Follow-up Information    Follow up with [facility]. In 5 weeks.   Contact information:   [address] [place] [address] [phone]         Newborn Data: Live born female  Birth Weight: 7 lb 6.9 oz (3371 g) APGAR: 9, 9  Home with mother.  Greg Cratty M 02/21/2012, 11:01 AM

## 2012-02-22 ENCOUNTER — Inpatient Hospital Stay (HOSPITAL_COMMUNITY): Admission: RE | Admit: 2012-02-22 | Payer: 59 | Source: Ambulatory Visit

## 2012-02-23 ENCOUNTER — Inpatient Hospital Stay (HOSPITAL_COMMUNITY): Admission: RE | Admit: 2012-02-23 | Payer: 59 | Source: Ambulatory Visit

## 2012-04-15 ENCOUNTER — Ambulatory Visit (INDEPENDENT_AMBULATORY_CARE_PROVIDER_SITE_OTHER): Payer: Self-pay | Admitting: Obstetrics and Gynecology

## 2012-04-15 ENCOUNTER — Encounter: Payer: Self-pay | Admitting: Obstetrics and Gynecology

## 2012-04-15 ENCOUNTER — Telehealth: Payer: Self-pay | Admitting: Obstetrics and Gynecology

## 2012-04-15 DIAGNOSIS — K5900 Constipation, unspecified: Secondary | ICD-10-CM

## 2012-04-15 DIAGNOSIS — D649 Anemia, unspecified: Secondary | ICD-10-CM

## 2012-04-15 DIAGNOSIS — O99810 Abnormal glucose complicating pregnancy: Secondary | ICD-10-CM

## 2012-04-15 DIAGNOSIS — Z1231 Encounter for screening mammogram for malignant neoplasm of breast: Secondary | ICD-10-CM

## 2012-04-15 DIAGNOSIS — Z124 Encounter for screening for malignant neoplasm of cervix: Secondary | ICD-10-CM

## 2012-04-15 DIAGNOSIS — O24419 Gestational diabetes mellitus in pregnancy, unspecified control: Secondary | ICD-10-CM

## 2012-04-15 LAB — HEMOGLOBIN: Hemoglobin: 12.2 g/dL (ref 12.0–15.0)

## 2012-04-15 MED ORDER — NORETHINDRONE 0.35 MG PO TABS: 1.0000 | ORAL_TABLET | Freq: Every day | ORAL | Status: DC

## 2012-04-15 NOTE — Patient Instructions (Addendum)
OTC Colace daily Look for Dermatologist in the phone book

## 2012-04-15 NOTE — Progress Notes (Signed)
Subjective:     Jessica Cervantes is a 42 y.o. female who presents for a postpartum visit. Pt interested in OCP's for [facility]. Pt c/o lower back pain and groin pain. No UTI sx's.   I have fully reviewed the prenatal and intrapartum course. She had GDM and anemia.  She specifically denies dysuria or abdominal pain.  She has pubic bone pain and back pain which occur only at night   Date of delivery: 02/19/2012 Female Name: [facility] Vaginal delivery:yes Cesarean section:no Tubal ligation:no GDM:yes Breast Feeding:yes Bottle Feeding:yes Post-Partum Blues:no Abnormal pap:no Normal GU function: yes Normal GI function:yes Returning to work:no EPDS: 2  Patient is not sexually active.   The following portions of the patient's history were reviewed and updated as appropriate: allergies, current medications, past family history, past medical history, past social history, past surgical history and problem list.  Review of Systems Pertinent items are noted in HPI.   Objective:    BP 94/60  Wt 206 lb (93.441 kg)  Breastfeeding? Yes  General:  alert, cooperative and no distress     Lungs: clear to auscultation bilaterally  Heart:  regular rate and rhythm, S1, S2 normal, no murmur  Abdomen: soft, non-tender; bowel sounds normal; no masses,  no organomegaly   Vulva:  normal  Vagina: normal vagina  Cervix:  normal  Corpus: normal size, contour, position, consistency, mobility, non-tender  Adnexa:  normal adnexa             Assessment:     Normal postpartum exam.  Pap smear done at today's visit. with HR HPV.  No hx of abnl Hx GDM and anemia  Plan:  hgb Hgb A1C and random glucose Micronor to start today per pt request Needs Mg 6 mos after completion of nursing F/U 1 yr  Dierdre Forth MD 04/15/2012 9:18 AM

## 2012-04-16 LAB — HEMOGLOBIN A1C
Hgb A1c MFr Bld: 6 % — ABNORMAL HIGH (ref <5.7)
Mean Plasma Glucose: 126 mg/dL — ABNORMAL HIGH (ref <117)

## 2012-04-26 ENCOUNTER — Ambulatory Visit (INDEPENDENT_AMBULATORY_CARE_PROVIDER_SITE_OTHER): Payer: 59 | Admitting: Internal Medicine

## 2012-04-26 VITALS — BP 108/75 | HR 68 | Temp 97.8°F | Resp 16 | Ht 65.0 in | Wt 204.0 lb

## 2012-04-26 DIAGNOSIS — R7309 Other abnormal glucose: Secondary | ICD-10-CM

## 2012-04-26 DIAGNOSIS — R739 Hyperglycemia, unspecified: Secondary | ICD-10-CM

## 2012-04-26 LAB — GLUCOSE, POCT (MANUAL RESULT ENTRY): POC Glucose: 96 mg/dl (ref 70–99)

## 2012-04-26 LAB — POCT GLYCOSYLATED HEMOGLOBIN (HGB A1C): Hemoglobin A1C: 5.2

## 2012-04-26 NOTE — Patient Instructions (Addendum)

## 2012-04-26 NOTE — Progress Notes (Signed)
  Subjective:    Patient ID: Glenda Spelman Janvier, female    DOB: [birth year], 42 y.o.   MRN: [id number]  HPI Recently had a baby, was gestational diabetic, Glucose 126 and A1c 6.0 after delivery. Weighs 204. No sxs of diabetes. Husband speaks for her.  Review of Systems     Objective:   Physical Exam  Vitals reviewed. Constitutional: She is oriented to person, place, and time. She appears well-nourished. No distress.  Eyes: EOM are normal.  Cardiovascular: Normal rate, regular rhythm and normal heart sounds.   Pulmonary/Chest: Effort normal and breath sounds normal.  Neurological: She is alert and oriented to person, place, and time. Coordination normal.  Psychiatric: She has a normal mood and affect.   Results for orders placed in visit on 04/26/12  GLUCOSE, POCT (MANUAL RESULT ENTRY)      Component Value Range   POC Glucose 96  70 - 99 mg/dl  POCT GLYCOSYLATED HEMOGLOBIN (HGB A1C)      Component Value Range   Hemoglobin A1C 5.2            Assessment & Plan:  Glucose intolerance Refer for diabetic /nutritional/weight loss counseling if a1c rises Goal is lose 20lbs

## 2012-12-27 ENCOUNTER — Encounter (HOSPITAL_COMMUNITY): Payer: Self-pay | Admitting: Emergency Medicine

## 2012-12-27 ENCOUNTER — Emergency Department (HOSPITAL_COMMUNITY)
Admission: EM | Admit: 2012-12-27 | Discharge: 2012-12-27 | Disposition: A | Discharge status: Home / Self Care | Payer: 59 | Source: Home / Self Care

## 2012-12-27 DIAGNOSIS — J302 Other seasonal allergic rhinitis: Secondary | ICD-10-CM

## 2012-12-27 DIAGNOSIS — J309 Allergic rhinitis, unspecified: Secondary | ICD-10-CM

## 2012-12-27 MED ORDER — FLUTICASONE PROPIONATE 50 MCG/ACT NA SUSP: 2.0000 | Freq: Two times a day (BID) | NASAL | Status: DC

## 2012-12-27 MED ORDER — FEXOFENADINE HCL 180 MG PO TABS: 180.0000 mg | ORAL_TABLET | Freq: Every day | ORAL | Status: DC

## 2012-12-27 MED ORDER — AZITHROMYCIN 250 MG PO TABS: ORAL_TABLET | ORAL | Status: DC

## 2012-12-27 NOTE — ED Provider Notes (Addendum)
CSN: [id number]     Arrival date & time 12/27/12  1304 History   None    Chief Complaint  Patient presents with  . Facial Pain   (Consider location/radiation/quality/duration/timing/severity/associated sxs/prior Treatment) Patient is a 42 y.o. female presenting with URI.  URI Presenting symptoms: congestion, ear pain, facial pain and rhinorrhea   Presenting symptoms: no fever and no sore throat   Severity:  Mild Onset quality:  Gradual Duration:  3 days Timing:  Intermittent Progression:  Waxing and waning Chronicity:  Recurrent Associated symptoms: sinus pain     Past Medical History  Diagnosis Date  . H/O constipation 04/07/09  . H/O hemorrhoids 09/02/09  . FH: diabetes mellitus   . H/O malaria   . History of anemia   . Female circumcision   . GBS carrier   . AMA (advanced maternal age) multigravida 35+   . Rh negative, maternal   . Anemia   . H/O malaria   . Gestational diabetes   . Type o blood, rh negative 02/19/2012   Past Surgical History  Procedure Laterality Date  . Gallbladder surgery  2010   Family History  Problem Relation Age of Onset  . Diabetes Mother    History  Substance Use Topics  . Smoking status: Never Smoker   . Smokeless tobacco: Never Used  . Alcohol Use: No   OB History   Grav Para Term Preterm Abortions TAB SAB Ect Mult Living   3 3 3       3      Review of Systems  Constitutional: Negative.  Negative for fever.  HENT: Positive for ear pain, congestion, rhinorrhea and sinus pressure. Negative for sore throat.     Allergies  Review of patient's allergies indicates no known allergies.  Home Medications   Current Outpatient Rx  Name  Route  Sig  Dispense  Refill  . azithromycin (ZITHROMAX Z-PAK) 250 MG tablet      Take as directed on pack   6 each   0   . Fe Cbn-Fe Gluc-FA-B12-C-DSS (FERRALET 90) 90-1 MG TABS   Oral   Take 1 tablet by mouth daily.   30 each   2   . fexofenadine (ALLEGRA) 180 MG tablet   Oral   Take  1 tablet (180 mg total) by mouth daily.   30 tablet   1   . fluticasone (FLONASE) 50 MCG/ACT nasal spray   Nasal   Place 2 sprays into the nose 2 (two) times daily.   1 g   2   . ibuprofen (ADVIL,MOTRIN) 600 MG tablet   Oral   Take 1 tablet (600 mg total) by mouth every 6 (six) hours.   30 tablet   2   . norethindrone (ORTHO MICRONOR) 0.35 MG tablet   Oral   Take 1 tablet (0.35 mg total) by mouth daily.   1 Package   11   . oxyCODONE-acetaminophen (PERCOCET/ROXICET) 5-325 MG per tablet   Oral   Take 1-2 tablets by mouth every 4 (four) hours as needed (moderate - severe pain).   20 tablet   0   . PRENATAL VITAMINS PO   Oral   Take by mouth.          BP 100/79  Pulse 75  Temp(Src) 98 F (36.7 C) (Oral)  Resp 16  SpO2 100%  Breastfeeding? Yes Physical Exam  Nursing note and vitals reviewed. Constitutional: She is oriented to person, place, and time. She appears well-developed and  well-nourished. No distress.  HENT:  Head: Normocephalic.  Right Ear: External ear normal.  Left Ear: External ear normal.  Mouth/Throat: Oropharynx is clear and moist.  Eyes: Pupils are equal, round, and reactive to light.  Neck: Normal range of motion. Neck supple.  Cardiovascular: Regular rhythm and normal heart sounds.   Pulmonary/Chest: Breath sounds normal.  Lymphadenopathy:    She has no cervical adenopathy.  Neurological: She is alert and oriented to person, place, and time.  Skin: Skin is warm and dry.    ED Course  Procedures (including critical care time) Labs Review Labs Reviewed - No data to display Imaging Review No results found.  MDM      Linna Hoff, MD 12/27/12 1478  Linna Hoff, MD 12/28/12 [phone]

## 2012-12-27 NOTE — ED Notes (Signed)
Co left side facial pain for three days now.   Tylenol tried but no relief.

## 2013-04-10 NOTE — L&D Delivery Note (Addendum)
2316: Call from MAU nurse reporting patient's presence and dilation of C/C/+1.  Requesting provider to unit to assess and standby for transfer.  Upon arrival, patient C/C/+2 but with inadequate pushing efforts.  Cat I FT noted and staff informed okay to transfer.  Provider remained at bedside.  Patient to birthing suites, room prepped, and patient delivered as below with encouragement.  Arabic interpreter called and on phone throughout delivery and repair.    Delivery Note At 11:49 PM, on Mar 14, 2014, a viable female, name unknown, was delivered via Vaginal, Spontaneous Delivery (Presentation: Left Occiput Anterior).  Mother with tight vaginal introitus, due to female circumcision, but episiotomy deferred.  Upon delivery of fetal head, shoulders delivered easily and infant with good tone and spontaneous cry.  Tactile stimulation given by provider and infant placed upon mother's abdomen.  However, patient request infant be removed; provider continued to hold infant and give tactile stimulation. Infant APGARs: 9, 9.  Cord clamped, cut, and blood collected. Placenta delivered spontaneously and noted to be intact with 3VC upon inspection.  Vaginal inspection revealed 1st degree laceration that was repaired with lidocaine for local anesthetic.  Fundus firm, at the umbilicus, and bleeding small to moderate, but cervix noted to be ~2 cm from introitus.   Mother hemodynamically stable and infant in warmer prior to provider exit.  Mother unsure of birth control method and opts to breastfeed.  Infant weight at one hour of life: 7 lb 10 oz (3459 g).     Anesthesia: None  Episiotomy: None Lacerations: 1st degree;Perineal Suture Repair: 3.0 vicryl Est. Blood Loss (mL): 250  Mom to postpartum.  Baby to Couplet care / Skin to Skin.  Cecila Satcher LYNN MSN, CNM 03/15/2014, 12:55 AM   Addendum S: Nurse call stating patient having constant trickle.  Fundus is boggy, but firms with massage. O: Fundus firming, U/-2,  Massage yield small 10mL clot.  Peri pad saturated, but appears small with ~7225mL blood A: Uterine Atony P: Cytotec 1000mg  PR Will continue to monitor Tx to PP as ordered

## 2013-07-01 ENCOUNTER — Encounter: Payer: Self-pay | Admitting: Physician Assistant

## 2013-07-01 ENCOUNTER — Ambulatory Visit (INDEPENDENT_AMBULATORY_CARE_PROVIDER_SITE_OTHER): Payer: Self-pay | Admitting: Physician Assistant

## 2013-07-01 VITALS — BP 110/60 | HR 81 | Temp 99.0°F | Resp 16 | Ht 64.75 in | Wt 215.8 lb

## 2013-07-01 DIAGNOSIS — K645 Perianal venous thrombosis: Secondary | ICD-10-CM

## 2013-07-01 NOTE — Patient Instructions (Signed)
Hemorrhoid Care After HOME CARE INSTRUCTIONS  Avoid straining when having bowel movements.  Avoid heavy lifting (more than 10 pounds (4.5 kilograms)).  Only take over-the-counter or prescription medicines for pain, discomfort, or fever as directed by your caregiver.  Take hot sitz baths for 20 to 30 minutes, 3 to 4 times per day.  To keep swelling down, apply an ice pack for twenty minutes three to four times per day between sitz baths. Use a towel between your skin and the ice pack. Do not do this if it causes too much discomfort.  Keep anal area clean and dry. Following a bowel movement, you can gently wash the area with tucks (available for purchase at a drugstore) or cotton swabs. Gently pat the area dry. Do not rub the area.  Eat a well balanced diet and drink 6 to 8 glasses of water every day to avoid constipation. A bulk laxative may be also be helpful. SEEK MEDICAL CARE IF:   You have increasing pain or tenderness near or in the surgical site.  You are unable to eat or drink.  You develop nausea or vomiting.  You develop uncontrolled bleeding such as soaking two to three pads in one hour.  You have constipation, not helped by changing your diet or increasing your fluid intake. Pain medications are a common cause of constipation.  You have pain and redness (inflammation) extending outside the area of your surgery.  You develop an unexplained oral temperature above 102 F (38.9 C), or any other signs of infection.  You have any other questions or concerns following surgery. Document Released: 06/17/2003 Document Revised: 06/19/2011 Document Reviewed: 09/14/2008 [facility] Patient Information 2014 [place], [place]. Hemorrhoids Hemorrhoids are puffy (swollen) veins around the rectum or anus. Hemorrhoids can cause pain, itching, bleeding, or irritation. HOME CARE  Eat foods with fiber, such as whole grains, beans, nuts, fruits, and vegetables. Ask your doctor about taking  products with added fiber in them (fibersupplements).  Drink enough fluid to keep your pee (urine) clear or pale yellow.  Exercise often.  Go to the bathroom when you have the urge to poop. Do not wait.  Avoid straining to poop (bowel movement).  Keep the butt area dry and clean. Use wet toilet paper or moist paper towels.  Medicated creams and medicine inserted into the anus (anal suppository) may be used or applied as told.  Only take medicine as told by your doctor.  Take a warm water bath (sitz bath) for 15 20 minutes to ease pain. Do this 3 4 times a day.  Place ice packs on the area if it is tender or puffy. Use the ice packs between the warm water baths.  Put ice in a plastic bag.  Place a towel between your skin and the bag.  Leave the ice on for 15-20 minutes, 03-04 times a day.  Do not use a donut-shaped pillow or sit on the toilet for a long time. GET HELP RIGHT AWAY IF:   You have more pain that is not controlled by treatment or medicine.  You have bleeding that will not stop.  You have trouble or are unable to poop (bowel movement).  You have pain or puffiness outside the area of the hemorrhoids. MAKE SURE YOU:   Understand these instructions.  Will watch your condition.  Will get help right away if you are not doing well or get worse. Document Released: 01/04/2008 Document Revised: 03/13/2012 Document Reviewed: 02/06/2012 [facility] Patient Information 2014 [place], [place].

## 2013-07-01 NOTE — Progress Notes (Signed)
   Subjective:    Patient ID: Jessica Cervantes, female    DOB: 31-Aug-1970, 43 y.o.   MRN: [id number]   PCP: Purcell NailsOBERTS,ANGELA Y, MD  Chief Complaint  Patient presents with  . hemorrhoids    Medications, allergies, past medical history, surgical history, family history, social history and problem list reviewed and updated.  HPI  Presents with several days of pain from hemorrhoids.  She's had episodic flares of this x 3 years, since the birth of her second of 3 children.  This episode is worse. She reports she regularly has to strain to have BM, and she tries to eat a diet higher in fiber, but doesn't always get as much fiber as she knows she should.  She stays at home with her 5814 month old and 42 year old, and her oldest son is in 2nd grade.  Review of Systems As above.    Objective:   Physical Exam BP 110/60  Pulse 81  Temp(Src) 99 F (37.2 C) (Oral)  Resp 16  Ht 5' 4.75" (1.645 m)  Wt 215 lb 12.8 oz (97.886 kg)  BMI 36.17 kg/m2  SpO2 100% WDWNBF, A&O x 3. Normal respiratory effort. Skin is warm and dry. Behavior, mood and affect are appropriate. Rectal exam reveals a small thrombosed hemorrhoid at 6 o'clock, with a small ulceration of the left side.  Very tender.  No surrounding edema or erythema.  Not fluctuant and there is no drainage.  With her permission, the area was prepped and local anesthesia provided with 2 cc of 2% lidocaine with epi.  Incision with 15 blade allowed evacuation of the tiny thrombosis. The area was cleansed and gauze placed as dressing.       Assessment & Plan:  1. Thrombosed external hemorrhoid After care instructions provided. Counseled on the need to adequate hydration, dietary fiber and physical activity. RTC PRN.   Jessica Brashelle S. Vara Mairena, PA-C Physician Assistant-Certified [facility]

## 2013-08-22 LAB — OB RESULTS CONSOLE RPR: RPR: NONREACTIVE

## 2013-08-22 LAB — OB RESULTS CONSOLE HIV ANTIBODY (ROUTINE TESTING): HIV: NONREACTIVE

## 2013-08-22 LAB — OB RESULTS CONSOLE ABO/RH: RH Type: NEGATIVE

## 2013-08-22 LAB — OB RESULTS CONSOLE RUBELLA ANTIBODY, IGM: RUBELLA: IMMUNE

## 2013-08-22 LAB — OB RESULTS CONSOLE GC/CHLAMYDIA
CHLAMYDIA, DNA PROBE: NEGATIVE
GC PROBE AMP, GENITAL: NEGATIVE

## 2013-08-22 LAB — OB RESULTS CONSOLE ANTIBODY SCREEN: Antibody Screen: NEGATIVE

## 2013-08-22 LAB — OB RESULTS CONSOLE HEPATITIS B SURFACE ANTIGEN: Hepatitis B Surface Ag: NEGATIVE

## 2013-12-05 ENCOUNTER — Encounter (INDEPENDENT_AMBULATORY_CARE_PROVIDER_SITE_OTHER): Payer: Self-pay | Admitting: Surgery

## 2013-12-05 ENCOUNTER — Ambulatory Visit (INDEPENDENT_AMBULATORY_CARE_PROVIDER_SITE_OTHER): Payer: 59 | Admitting: Surgery

## 2013-12-05 VITALS — BP 128/72 | HR 110 | Temp 97.2°F | Ht 66.0 in | Wt 218.0 lb

## 2013-12-05 DIAGNOSIS — K439 Ventral hernia without obstruction or gangrene: Secondary | ICD-10-CM

## 2013-12-05 NOTE — Progress Notes (Signed)
Subjective:     Patient ID: Jessica Cervantes, female   DOB: [birth year], 43 y.o.   MRN: [id number]  HPI This is a pleasant female referred by Dr.Naima Dillard for evaluation of an epigastric hernia. The patient is currently pregnant and due for delivery in December. She is accompanied by her husband as an interpreter. There is a significant language barrier. She has some mild discomfort from the hernia but no obstructive symptoms. Currently, they're small children in the room making the exam and discussion very difficult.  Review of Systems     Objective:   Physical Exam On exam, she is well in appearance. Her abdomen is gravid. It is otherwise soft and nontender. There is an easily reducible hernia in her upper abdomen    Assessment:     Epigastric hernia     Plan:     I explained the diagnosis to the family and patient. I am recommending continued conservative management with surgical repair after she has had her delivery. I discussed this with her and they seem to understand. They'll make an appointment with [place] after delivery. I did discuss the risk of incarcerations and signs and symptoms of incarceration and they'll call back should they have any other symptoms

## 2014-02-09 ENCOUNTER — Encounter (INDEPENDENT_AMBULATORY_CARE_PROVIDER_SITE_OTHER): Payer: Self-pay | Admitting: Surgery

## 2014-03-12 ENCOUNTER — Encounter (HOSPITAL_COMMUNITY): Payer: Self-pay

## 2014-03-12 ENCOUNTER — Inpatient Hospital Stay (HOSPITAL_COMMUNITY)
Admission: AD | Admit: 2014-03-12 | Discharge: 2014-03-12 | Disposition: A | Discharge status: Home / Self Care | Payer: 59 | Source: Ambulatory Visit | Attending: Obstetrics and Gynecology | Admitting: Obstetrics and Gynecology

## 2014-03-12 DIAGNOSIS — O3483 Maternal care for other abnormalities of pelvic organs, third trimester: Secondary | ICD-10-CM | POA: Insufficient documentation

## 2014-03-12 DIAGNOSIS — N90810 Female genital mutilation status, unspecified: Secondary | ICD-10-CM | POA: Insufficient documentation

## 2014-03-12 DIAGNOSIS — Z3A39 39 weeks gestation of pregnancy: Secondary | ICD-10-CM | POA: Insufficient documentation

## 2014-03-12 DIAGNOSIS — O09523 Supervision of elderly multigravida, third trimester: Secondary | ICD-10-CM | POA: Insufficient documentation

## 2014-03-12 DIAGNOSIS — O471 False labor at or after 37 completed weeks of gestation: Secondary | ICD-10-CM | POA: Diagnosis present

## 2014-03-12 DIAGNOSIS — Z833 Family history of diabetes mellitus: Secondary | ICD-10-CM | POA: Insufficient documentation

## 2014-03-12 LAB — OB RESULTS CONSOLE GBS: STREP GROUP B AG: NEGATIVE

## 2014-03-12 NOTE — MAU Provider Note (Signed)
Jessica Cervantes is a 43 y.o. G5P3013 at 39.1 weeks presents to MAU c/o stronger ctx since last night.  She denies vb or lof w/+FM.  Pt states she live [address].  Pt given the option of walking and being rechecked in 1 hour or going home and returning when ctx get longer, stronger and/or closer together  History     Patient Active Problem List   Diagnosis Date Noted  . Anemia 04/15/2012  . Type o blood, rh negative 02/19/2012  . Vaginal delivery 02/19/2012  . Perineal laceration 02/19/2012  . Premature rupture of membranes 02/18/2012  . Gestational diabetes 10/28/2011  . Cough, persistent 10/10/2011  . Nausea and vomiting in pregnancy 08/30/2011  . AMA (advanced maternal age) multigravida 35+ 08/29/2011  . Hx of gestational diabetes in prior pregnancy, currently pregnant 08/29/2011  . Reflux 08/29/2011  . H/O malaria   . Language Barrier (Arabic) 08/14/2008    Chief Complaint  Patient presents with  . Labor Eval   HPI  OB History    Gravida Para Term Preterm AB TAB SAB Ectopic Multiple Living   5 3 3  1  1   3       Past Medical History  Diagnosis Date  . H/O constipation 04/07/09  . H/O hemorrhoids 09/02/09  . FH: diabetes mellitus   . H/O malaria   . History of anemia   . Female circumcision   . GBS carrier   . AMA (advanced maternal age) multigravida 35+   . Rh negative, maternal   . Anemia   . H/O malaria   . Gestational diabetes   . Type o blood, rh negative 02/19/2012    Past Surgical History  Procedure Laterality Date  . Gallbladder surgery  2010    Family History  Problem Relation Age of Onset  . Diabetes Mother     History  Substance Use Topics  . Smoking status: Never Smoker   . Smokeless tobacco: Never Used  . Alcohol Use: No    Allergies: No Known Allergies  Prescriptions prior to admission  Medication Sig Dispense Refill Last Dose  . OVER THE COUNTER MEDICATION OTC Tylenol taking   Taking    ROS See HPI above, all other  systems are negative  Physical Exam   Blood pressure 114/74, pulse 82, height 5\' 6"  (1.676 m), weight 220 lb 6.4 oz (99.973 kg), currently breastfeeding.  Physical Exam Ext:  WNL ABD: Soft, non tender to palpation, no rebound or guarding SVE: 2-5/50/-3, unchanged from last ROB   ED Course  Assessment: IUP at  39.1weeks Membranes: intact FHR: Category 1, FHR 135 moderate variability, no decel, + accel CTX:  irregular Female circumcision    Plan: Pt decided to go home and return later.   Kick count and labor precautions given  Jony Ladnier, CNM, MSN 03/12/2014. 4:13 AM   Addendum at 0412 VE no change, continue with DC to home

## 2014-03-12 NOTE — MAU Note (Signed)
Ctx x 4 days. Denies LOF or VB. Positive fetal movement.

## 2014-03-12 NOTE — Discharge Instructions (Signed)

## 2014-03-14 ENCOUNTER — Encounter (HOSPITAL_COMMUNITY): Payer: Self-pay | Admitting: *Deleted

## 2014-03-14 ENCOUNTER — Inpatient Hospital Stay (HOSPITAL_COMMUNITY)
Admission: AD | Admit: 2014-03-14 | Discharge: 2014-03-14 | Disposition: A | Discharge status: Home / Self Care | Payer: 59 | Source: Ambulatory Visit | Attending: Obstetrics & Gynecology | Admitting: Obstetrics & Gynecology

## 2014-03-14 ENCOUNTER — Inpatient Hospital Stay (HOSPITAL_COMMUNITY)
Admission: AD | Admit: 2014-03-14 | Discharge: 2014-03-17 | DRG: 775 | Disposition: A | Discharge status: Home / Self Care | Payer: 59 | Source: Ambulatory Visit | Attending: Obstetrics & Gynecology | Admitting: Obstetrics & Gynecology

## 2014-03-14 DIAGNOSIS — Z758 Other problems related to medical facilities and other health care: Secondary | ICD-10-CM | POA: Diagnosis present

## 2014-03-14 DIAGNOSIS — O471 False labor at or after 37 completed weeks of gestation: Secondary | ICD-10-CM | POA: Insufficient documentation

## 2014-03-14 DIAGNOSIS — Z6741 Type O blood, Rh negative: Secondary | ICD-10-CM | POA: Diagnosis present

## 2014-03-14 DIAGNOSIS — Z833 Family history of diabetes mellitus: Secondary | ICD-10-CM | POA: Insufficient documentation

## 2014-03-14 DIAGNOSIS — Z3A39 39 weeks gestation of pregnancy: Secondary | ICD-10-CM | POA: Insufficient documentation

## 2014-03-14 DIAGNOSIS — Z789 Other specified health status: Secondary | ICD-10-CM | POA: Diagnosis present

## 2014-03-14 DIAGNOSIS — N898 Other specified noninflammatory disorders of vagina: Secondary | ICD-10-CM

## 2014-03-14 DIAGNOSIS — O3483 Maternal care for other abnormalities of pelvic organs, third trimester: Secondary | ICD-10-CM

## 2014-03-14 DIAGNOSIS — N90810 Female genital mutilation status, unspecified: Secondary | ICD-10-CM | POA: Diagnosis present

## 2014-03-14 DIAGNOSIS — Z603 Acculturation difficulty: Secondary | ICD-10-CM | POA: Diagnosis present

## 2014-03-14 DIAGNOSIS — O479 False labor, unspecified: Secondary | ICD-10-CM

## 2014-03-14 DIAGNOSIS — O09523 Supervision of elderly multigravida, third trimester: Secondary | ICD-10-CM

## 2014-03-14 DIAGNOSIS — Z3483 Encounter for supervision of other normal pregnancy, third trimester: Secondary | ICD-10-CM | POA: Diagnosis present

## 2014-03-14 DIAGNOSIS — O09529 Supervision of elderly multigravida, unspecified trimester: Secondary | ICD-10-CM

## 2014-03-14 LAB — WET PREP, GENITAL
CLUE CELLS WET PREP: NONE SEEN
TRICH WET PREP: NONE SEEN
Yeast Wet Prep HPF POC: NONE SEEN

## 2014-03-14 MED ORDER — LIDOCAINE HCL (PF) 1 % IJ SOLN
INTRAMUSCULAR | Status: AC
  Administered 2014-03-14: 30 mL
  Filled 2014-03-14: qty 30

## 2014-03-14 MED ORDER — LIDOCAINE HCL (PF) 1 % IJ SOLN
INTRAMUSCULAR | Status: AC
  Filled 2014-03-14: qty 5

## 2014-03-14 MED ORDER — OXYTOCIN 10 UNIT/ML IJ SOLN
INTRAMUSCULAR | Status: AC
  Administered 2014-03-14: 10 [IU] via INTRAMUSCULAR
  Filled 2014-03-14: qty 1

## 2014-03-14 NOTE — MAU Note (Signed)
Pt reports water broke at 1030. Prt very uncomfortable SVE 10/100/+1. Midwife called

## 2014-03-14 NOTE — MAU Note (Addendum)
Pt states she started contracting today, they are 5 minutes apart.  Denies LOF, has had some scant vaginal bleeding x2 days.

## 2014-03-14 NOTE — MAU Note (Signed)
Jessica at bedside.

## 2014-03-14 NOTE — Progress Notes (Signed)
[facility] Pt to [facility] via stretcher. Gerrit HeckJessica EMLY CNM with pt for transfer

## 2014-03-14 NOTE — Discharge Instructions (Signed)
Braxton Hicks Contractions °Contractions of the uterus can occur throughout pregnancy. Contractions are not always a sign that you are in labor.  °WHAT ARE BRAXTON HICKS CONTRACTIONS?  °Contractions that occur before labor are called Braxton Hicks contractions, or false labor. Toward the end of pregnancy (32-34 weeks), these contractions can develop more often and may become more forceful. This is not true labor because these contractions do not result in opening (dilatation) and thinning of the cervix. They are sometimes difficult to tell apart from true labor because these contractions can be forceful and people have different pain tolerances. You should not feel embarrassed if you go to the hospital with false labor. Sometimes, the only way to tell if you are in true labor is for your health care provider to look for changes in the cervix. °If there are no prenatal problems or other health problems associated with the pregnancy, it is completely safe to be sent home with false labor and await the onset of true labor. °HOW CAN YOU TELL THE DIFFERENCE BETWEEN TRUE AND FALSE LABOR? °False Labor °· The contractions of false labor are usually shorter and not as hard as those of true labor.   °· The contractions are usually irregular.   °· The contractions are often felt in the front of the lower abdomen and in the groin.   °· The contractions may go away when you walk around or change positions while lying down.   °· The contractions get weaker and are shorter lasting as time goes on.   °· The contractions do not usually become progressively stronger, regular, and closer together as with true labor.   °True Labor °· Contractions in true labor last 30-70 seconds, become very regular, usually become more intense, and increase in frequency.   °· The contractions do not go away with walking.   °· The discomfort is usually felt in the top of the uterus and spreads to the lower abdomen and low back.   °· True labor can be  determined by your health care provider with an exam. This will show that the cervix is dilating and getting thinner.   °WHAT TO REMEMBER °· Keep up with your usual exercises and follow other instructions given by your health care provider.   °· Take medicines as directed by your health care provider.   °· Keep your regular prenatal appointments.   °· Eat and drink lightly if you think you are going into labor.   °· If Braxton Hicks contractions are making you uncomfortable:   °· Change your position from lying down or resting to walking, or from walking to resting.   °· Sit and rest in a tub of warm water.   °· Drink 2-3 glasses of water. Dehydration may cause these contractions.   °· Do slow and deep breathing several times an hour.   °WHEN SHOULD I SEEK IMMEDIATE MEDICAL CARE? °Seek immediate medical care if: °· Your contractions become stronger, more regular, and closer together.   °· You have fluid leaking or gushing from your vagina.   °· You have a fever.   °· You pass blood-tinged mucus.   °· You have vaginal bleeding.   °· You have continuous abdominal pain.   °· You have low back pain that you never had before.   °· You feel your baby's head pushing down and causing pelvic pressure.   °· Your baby is not moving as much as it used to.   °Document Released: 03/27/2005 Document Revised: 04/01/2013 Document Reviewed: 01/06/2013 °ExitCare® Patient Information ©2015 [employer]. This information is not intended to replace advice given to you by your health care   provider. Make sure you discuss any questions you have with your health care provider. ° °Third Trimester of Pregnancy °The third trimester is from week 29 through week 42, months 7 through 9. This trimester is when your unborn baby (fetus) is growing very fast. At the end of the ninth month, the unborn baby is about 20 inches in length. It weighs about 6-10 pounds.  °HOME CARE  °Avoid all smoking, herbs, and alcohol. Avoid drugs not approved by your  doctor. °Only take medicine as told by your doctor. Some medicines are safe and some are not during pregnancy. °Exercise only as told by your doctor. Stop exercising if you start having cramps. °Eat regular, healthy meals. °Wear a good support bra if your breasts are tender. °Do not use hot tubs, steam rooms, or saunas. °Wear your seat belt when driving. °Avoid raw meat, uncooked cheese, and liter boxes and soil used by cats. °Take your prenatal vitamins. °Try taking medicine that helps you poop (stool softener) as needed, and if your doctor approves. Eat more fiber by eating fresh fruit, vegetables, and whole grains. Drink enough fluids to keep your pee (urine) clear or pale yellow. °Take warm water baths (sitz baths) to soothe pain or discomfort caused by hemorrhoids. Use hemorrhoid cream if your doctor approves. °If you have puffy, bulging veins (varicose veins), wear support hose. Raise (elevate) your feet for 15 minutes, 3-4 times a day. Limit salt in your diet. °Avoid heavy lifting, wear low heels, and sit up straight. °Rest with your legs raised if you have leg cramps or low back pain. °Visit your dentist if you have not gone during your pregnancy. Use a soft toothbrush to brush your teeth. Be gentle when you floss. °You can have sex (intercourse) unless your doctor tells you not to. °Do not travel far distances unless you must. Only do so with your doctor's approval. °Take prenatal classes. °Practice driving to the hospital. °Pack your hospital bag. °Prepare the baby's room. °Go to your doctor visits. °GET HELP IF: °You are not sure if you are in labor or if your water has broken. °You are dizzy. °You have mild cramps or pressure in your lower belly (abdominal). °You have a nagging pain in your belly area. °You continue to feel sick to your stomach (nauseous), throw up (vomit), or have watery poop (diarrhea). °You have bad smelling fluid coming from your vagina. °You have pain with peeing (urination). °GET  HELP RIGHT AWAY IF:  °You have a fever. °You are leaking fluid from your vagina. °You are spotting or bleeding from your vagina. °You have severe belly cramping or pain. °You lose or gain weight rapidly. °You have trouble catching your breath and have chest pain. °You notice sudden or extreme puffiness (swelling) of your face, hands, ankles, feet, or legs. °You have not felt the baby move in over an hour. °You have severe headaches that do not go away with medicine. °You have vision changes. °Document Released: 06/21/2009 Document Revised: 07/22/2012 Document Reviewed: 05/28/2012 °ExitCare® Patient Information ©2015 [employer]. This information is not intended to replace advice given to you by your health care provider. Make sure you discuss any questions you have with your health care provider. ° °

## 2014-03-14 NOTE — Progress Notes (Signed)
Gerrit HeckJessica Emly, CNM called MAU, notified that pt can d/c home with labor precautions, and to call provider with any other questions/issues.

## 2014-03-14 NOTE — MAU Provider Note (Signed)
History    Rudolph M Jenne Hart Carwinis 43 y.o. is G5P3013 at 39.3wks who presents, unannounced, for contractions. Patient states she has been having pain since Tuesday that is intermittent.  Patient states she was seen at that time and sent home.  Patient started having contractions q5-616min starting last night and has had some LOF and VB.  Patient reports fetal movement and admits to drinking "a little cup of water" today.  Patient desires epidural for pain management and is unsure of GBS status.    Patient Active Problem List   Diagnosis Date Noted  . Anemia 04/15/2012  . Type o blood, rh negative 02/19/2012  . Vaginal delivery 02/19/2012  . Perineal laceration 02/19/2012  . Premature rupture of membranes 02/18/2012  . Gestational diabetes 10/28/2011  . Cough, persistent 10/10/2011  . Nausea and vomiting in pregnancy 08/30/2011  . AMA (advanced maternal age) multigravida 35+ 08/29/2011  . Hx of gestational diabetes in prior pregnancy, currently pregnant 08/29/2011  . Reflux 08/29/2011  . H/O malaria   . Language Barrier (Arabic) 08/14/2008    Chief Complaint  Patient presents with  . Contractions   HPI  OB History    Gravida Para Term Preterm AB TAB SAB Ectopic Multiple Living   5 3 3  1  1   3       Past Medical History  Diagnosis Date  . H/O constipation 04/07/09  . H/O hemorrhoids 09/02/09  . FH: diabetes mellitus   . H/O malaria   . History of anemia   . Female circumcision   . GBS carrier   . AMA (advanced maternal age) multigravida 35+   . Rh negative, maternal   . Anemia   . H/O malaria   . Gestational diabetes   . Type o blood, rh negative 02/19/2012    Past Surgical History  Procedure Laterality Date  . Gallbladder surgery  2010    Family History  Problem Relation Age of Onset  . Diabetes Mother     History  Substance Use Topics  . Smoking status: Never Smoker   . Smokeless tobacco: Never Used  . Alcohol Use: No    Allergies: No Known  Allergies  Prescriptions prior to admission  Medication Sig Dispense Refill Last Dose  . Acetaminophen (TYLENOL PO) Take 2 tablets by mouth every 6 (six) hours as needed (headache).   Past Month at Unknown time  . Prenatal Vit-Fe Fumarate-FA (PRENATAL MULTIVITAMIN) TABS tablet Take 1 tablet by mouth daily at 12 noon.   03/13/2014 at Unknown time  . OVER THE COUNTER MEDICATION OTC Tylenol taking   Taking    Review of Systems  Genitourinary:       +VB +LoF +FM +Ctx  All other systems reviewed and are negative.   See HPI Above Physical Exam   Blood pressure 107/70, pulse 62, temperature 98.2 F (36.8 C), temperature source Oral, resp. rate 18, height 5' 4.5" (1.638 m), weight 218 lb 9.6 oz (99.156 kg).  Physical Exam  Constitutional: She is oriented to person, place, and time. She appears well-developed and well-nourished.  Cardiovascular: Normal rate and regular rhythm.   Respiratory: Effort normal.  GI: Soft.  Genitourinary: Vaginal discharge found.  -Thin white discharge noted at introitus-Wet prep collected - SVE: 3/50/-2, Medium, Posterior, Bloody Show  Musculoskeletal: Normal range of motion.  Neurological: She is alert and oriented to person, place, and time.  Skin: Skin is warm and dry.   FHR: 145 bpm, Mod Var, -Decels, -Accels UC:  Mild contraction palpated occasionally graphed ED Course  Assessment: IUP at 39.3wks Cat I FT Contractions Vaginal Discharge  Plan: -PE as above -Discussed with patient possible inadequate hydration as cause for contractions -Wet prep sent for vaginal discharge -Fern Negative -Await reactive NST -Educated on who and when to call for labor and fetal concerns  Follow Up (1316) -Wet prep negative -Labor precautions and adequate hydration discussed/verbal education provided -Discharged to home in stable condition -Encouraged to call if any questions or concerns arise prior to next scheduled office visit.   Bertina Guthridge LYNN CNM,  MSN 03/14/2014 12:05 PM

## 2014-03-15 ENCOUNTER — Encounter (HOSPITAL_COMMUNITY): Payer: Self-pay | Admitting: *Deleted

## 2014-03-15 LAB — CBC
HCT: 33.1 % — ABNORMAL LOW (ref 36.0–46.0)
Hemoglobin: 11.5 g/dL — ABNORMAL LOW (ref 12.0–15.0)
MCH: 31.3 pg (ref 26.0–34.0)
MCHC: 34.7 g/dL (ref 30.0–36.0)
MCV: 90.2 fL (ref 78.0–100.0)
Platelets: 189 10*3/uL (ref 150–400)
RBC: 3.67 MIL/uL — AB (ref 3.87–5.11)
RDW: 14.1 % (ref 11.5–15.5)
WBC: 9.8 10*3/uL (ref 4.0–10.5)

## 2014-03-15 MED ORDER — ONDANSETRON HCL 4 MG/2ML IJ SOLN: 4.0000 mg | Freq: Four times a day (QID) | INTRAMUSCULAR | Status: DC | PRN

## 2014-03-15 MED ORDER — ACETAMINOPHEN 325 MG PO TABS: 650.0000 mg | ORAL_TABLET | ORAL | Status: DC | PRN

## 2014-03-15 MED ORDER — BENZOCAINE-MENTHOL 20-0.5 % EX AERO
1.0000 "application " | INHALATION_SPRAY | CUTANEOUS | Status: DC | PRN
  Administered 2014-03-15: 1 via TOPICAL
  Filled 2014-03-15: qty 56

## 2014-03-15 MED ORDER — OXYCODONE-ACETAMINOPHEN 5-325 MG PO TABS: 2.0000 | ORAL_TABLET | ORAL | Status: DC | PRN

## 2014-03-15 MED ORDER — CITRIC ACID-SODIUM CITRATE 334-500 MG/5ML PO SOLN: 30.0000 mL | ORAL | Status: DC | PRN

## 2014-03-15 MED ORDER — NALBUPHINE HCL 10 MG/ML IJ SOLN: 10.0000 mg | INTRAMUSCULAR | Status: DC | PRN

## 2014-03-15 MED ORDER — TETANUS-DIPHTH-ACELL PERTUSSIS 5-2.5-18.5 LF-MCG/0.5 IM SUSP: 0.5000 mL | Freq: Once | INTRAMUSCULAR | Status: DC

## 2014-03-15 MED ORDER — ONDANSETRON HCL 4 MG/2ML IJ SOLN: 4.0000 mg | INTRAMUSCULAR | Status: DC | PRN

## 2014-03-15 MED ORDER — OXYCODONE-ACETAMINOPHEN 5-325 MG PO TABS
1.0000 | ORAL_TABLET | ORAL | Status: DC | PRN
  Administered 2014-03-15: 1 via ORAL
  Filled 2014-03-15: qty 1

## 2014-03-15 MED ORDER — IBUPROFEN 600 MG PO TABS: 600.0000 mg | ORAL_TABLET | Freq: Once | ORAL | Status: DC

## 2014-03-15 MED ORDER — RHO D IMMUNE GLOBULIN 1500 UNIT/2ML IJ SOSY
300.0000 ug | PREFILLED_SYRINGE | Freq: Once | INTRAMUSCULAR | Status: AC
  Administered 2014-03-15: 300 ug via INTRAMUSCULAR
  Filled 2014-03-15: qty 2

## 2014-03-15 MED ORDER — SIMETHICONE 80 MG PO CHEW
80.0000 mg | CHEWABLE_TABLET | ORAL | Status: DC | PRN
Start: 2014-03-15 — End: 2014-03-17

## 2014-03-15 MED ORDER — OXYCODONE-ACETAMINOPHEN 5-325 MG PO TABS: 1.0000 | ORAL_TABLET | ORAL | Status: DC | PRN

## 2014-03-15 MED ORDER — SENNOSIDES-DOCUSATE SODIUM 8.6-50 MG PO TABS
2.0000 | ORAL_TABLET | ORAL | Status: DC
  Administered 2014-03-16 (×2): 2 via ORAL
  Filled 2014-03-15 (×2): qty 2

## 2014-03-15 MED ORDER — DIPHENHYDRAMINE HCL 25 MG PO CAPS: 25.0000 mg | ORAL_CAPSULE | Freq: Four times a day (QID) | ORAL | Status: DC | PRN

## 2014-03-15 MED ORDER — LANOLIN HYDROUS EX OINT: TOPICAL_OINTMENT | CUTANEOUS | Status: DC | PRN

## 2014-03-15 MED ORDER — LIDOCAINE HCL (PF) 1 % IJ SOLN: 30.0000 mL | Freq: Once | INTRAMUSCULAR | Status: DC

## 2014-03-15 MED ORDER — DIBUCAINE 1 % RE OINT: 1.0000 "application " | TOPICAL_OINTMENT | RECTAL | Status: DC | PRN

## 2014-03-15 MED ORDER — OXYTOCIN 10 UNIT/ML IJ SOLN: 10.0000 [IU] | Freq: Once | INTRAMUSCULAR | Status: DC

## 2014-03-15 MED ORDER — ZOLPIDEM TARTRATE 5 MG PO TABS: 5.0000 mg | ORAL_TABLET | Freq: Every evening | ORAL | Status: DC | PRN

## 2014-03-15 MED ORDER — IBUPROFEN 600 MG PO TABS
600.0000 mg | ORAL_TABLET | Freq: Four times a day (QID) | ORAL | Status: DC
  Administered 2014-03-15 – 2014-03-17 (×8): 600 mg via ORAL
  Filled 2014-03-15 (×9): qty 1

## 2014-03-15 MED ORDER — LIDOCAINE HCL (PF) 1 % IJ SOLN
30.0000 mL | INTRAMUSCULAR | Status: DC | PRN
  Filled 2014-03-15: qty 30

## 2014-03-15 MED ORDER — MISOPROSTOL 200 MCG PO TABS
ORAL_TABLET | ORAL | Status: AC
  Filled 2014-03-15: qty 5

## 2014-03-15 MED ORDER — PRENATAL MULTIVITAMIN CH
1.0000 | ORAL_TABLET | Freq: Every day | ORAL | Status: DC
  Administered 2014-03-15: 1 via ORAL
  Filled 2014-03-15 (×2): qty 1

## 2014-03-15 MED ORDER — ONDANSETRON HCL 4 MG PO TABS: 4.0000 mg | ORAL_TABLET | ORAL | Status: DC | PRN

## 2014-03-15 MED ORDER — OXYCODONE-ACETAMINOPHEN 5-325 MG PO TABS
2.0000 | ORAL_TABLET | ORAL | Status: DC | PRN
Start: 2014-03-15 — End: 2014-03-17

## 2014-03-15 MED ORDER — MISOPROSTOL 200 MCG PO TABS
1000.0000 ug | ORAL_TABLET | Freq: Once | ORAL | Status: AC
  Administered 2014-03-15: 1000 ug via RECTAL

## 2014-03-15 MED ORDER — WITCH HAZEL-GLYCERIN EX PADS: 1.0000 "application " | MEDICATED_PAD | CUTANEOUS | Status: DC | PRN

## 2014-03-15 NOTE — Plan of Care (Signed)
Problem: Phase I Progression Outcomes Goal: Pain controlled with appropriate interventions Outcome: Completed/Met Date Met:  03/15/14 Goal: Voiding adequately Outcome: Completed/Met Date Met:  03/15/14 Goal: VS, stable, temp < 100.4 degrees F Outcome: Completed/Met Date Met:  03/15/14 Goal: Initial discharge plan identified Outcome: Completed/Met Date Met:  03/15/14

## 2014-03-15 NOTE — Progress Notes (Signed)
  Subjective: Postpartum Day 0: Vaginal delivery, 1st degree perineal laceration Patient up ad lib, reports no syncope or dizziness. Feeding:  Breast Contraceptive plan:  Undecided  Objective: Vital signs in last 24 hours: Temp:  [98.2 F (36.8 C)-99.5 F (37.5 C)] 99.5 F (37.5 C) (12/06 0630) Pulse Rate:  [58-84] 70 (12/06 0630) Resp:  [18] 18 (12/06 0630) BP: (104-133)/(56-84) 104/58 mmHg (12/06 0630) SpO2:  [100 %] 100 % (12/06 0630) Weight:  [218 lb 9.6 oz (99.156 kg)] 218 lb 9.6 oz (99.156 kg) (12/05 1127)  Physical Exam:  General: alert Lochia: appropriate Uterine Fundus: firm Perineum: healing well DVT Evaluation: No evidence of DVT seen on physical exam. Negative Homan's sign.    Recent Labs  03/15/14 0551  HGB 11.5*  HCT 33.1*    Assessment/Plan: Status post vaginal delivery day 0. Stable Continue current care.   Ann-Marie Kluge, VICKICNM 03/15/2014, 10:26 AM

## 2014-03-15 NOTE — Plan of Care (Signed)
Problem: Discharge Progression Outcomes Goal: Remove staples per MD order Outcome: Not Applicable Date Met:  03/15/14     

## 2014-03-15 NOTE — H&P (Signed)
Hart Carwinmani M Mcmath is a 43 y.o. female, G5P3013 at 39.3 weeks, presenting for active labor.  Patient arrive to MAU, unannounced, and is C/C/+1.  Patient reports SROM prior to arrival.  Patient desires epidural or "injection for pain" and is GBS negative.  Patient denies complications with this pregnancy.   Patient Active Problem List   Diagnosis Date Noted  . SVD (spontaneous vaginal delivery) 03/15/2014  . 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 Braxton Hicks contractions 03/14/2014  . Anemia 04/15/2012  . Type o blood, rh negative 02/19/2012  . Vaginal delivery 02/19/2012  . Perineal laceration 02/19/2012  . Premature rupture of membranes 02/18/2012  . Gestational diabetes 10/28/2011  . Cough, persistent 10/10/2011  . Nausea and vomiting in pregnancy 08/30/2011  . AMA (advanced maternal age) multigravida 35+ 08/29/2011  . Hx of gestational diabetes in prior pregnancy, currently pregnant 08/29/2011  . Reflux 08/29/2011  . H/O malaria   . Language Barrier (Arabic) 08/14/2008    History of present pregnancy: Patient entered care at 10.2 weeks.   EDC of 03/18/2014 was established by LMP of 06/11/2013.   Anatomy scan:  18.6 weeks, with normal findings and an posterior placenta.   Additional US evaluations: Anatomy: c/w dates, no anomalies, female, cvx 3.7cm, post placenta.   Significant prenatal events:  1st Trimester: C/O pelvic pain. Declined genetic testing.  2nd Trimester: C/O hair loss, joint pain, but declined PT. Issues with nausea and diagnosed with abdominal hernia with referral to general surgeon.  3rd Trimester: C/O cough and congestion. Contractions and bloody show Last evaluation:  03/12/2014 at 39.1wks by S. Alferd Apaevane Johnson, CNM---3/60/-3.  FHR 130. BP 102/64. Wt 220lbs  OB History    Gravida Para Term Preterm AB TAB SAB Ectopic Multiple Living   5 4 4  1  1   0 4    -11/2005: 7lb Female at 2140wks via SVD with Epidural -02/2009: 6.12lb Female at 37.5wks via SVD  -2012: SAB -02/2012: 7.6lb  Female at unknown wks via SVD with Epidural -Present  Past Medical History  Diagnosis Date  . H/O constipation 04/07/09  . H/O hemorrhoids 09/02/09  . FH: diabetes mellitus   . H/O malaria   . History of anemia   . Female circumcision   . GBS carrier   . AMA (advanced maternal age) multigravida 35+   . Rh negative, maternal   . Anemia   . H/O malaria   . Gestational diabetes   . Type o blood, rh negative 02/19/2012   Past Surgical History  Procedure Laterality Date  . Gallbladder surgery  2010   Family History: family history includes Diabetes in her mother. Social History:  reports that she has never smoked. She has never used smokeless tobacco. She reports that she does not drink alcohol or use illicit drugs.   Prenatal Transfer Tool  Maternal Diabetes: No Genetic Screening: Declined Maternal Ultrasounds/Referrals: Normal Fetal Ultrasounds or other Referrals:  None Maternal Substance Abuse:  No Significant Maternal Medications:  Meds include: Other: PNV Tylenol Significant Maternal Lab Results: Lab values include: Group B Strep negative, Rh negative    ROS:  +CTX, +LOF, +VB, +FM  No Known Allergies     Blood pressure 111/68, pulse 68, resp. rate 18, unknown if currently breastfeeding.  Chest clear Heart RRR without murmur Abd gravid, NT Pelvic: Proven to 7+lbs Ext: WNL  FHR:145 bpm, Mod Var, -Decels, +Accels  UCs:  Q1-873min, palpates moderate  Prenatal labs: ABO, Rh: O/Negative/-- (05/15 0000) Antibody: Negative (05/15 0000) Rubella:  Immune RPR: Nonreactive (05/15 0000)  HBsAg: Negative (05/15 0000)  HIV: Non-reactive (05/15 0000)  GBS: Negative (12/03 0000) Sickle cell/Hgb electrophoresis:  Normal Pap:  Uknown GC:  Negative Chlamydia:  Negative Genetic screenings:  Declined Glucola:  Normal Other:  None    Assessment IUP at 39.3wks Cat I FT 2nd Stage Labor GBS Negative rH Negative  Plan: Admit to BS for imminent delivery Admission  orders per [facility] protocol Anticipate SVD Will update Dr. Carmela HurtE. Kulwa   Andrewjames Weirauch LYNN CNM, MSN 03/15/2014, 1:08 AM

## 2014-03-15 NOTE — Lactation Note (Signed)
This note was copied from the chart of Boy Syan Marrufo. Lactation Consultation Note  Patient Name: Boy Burnis Kingfishermani Heng ZOXWR'UToday's Date: 03/15/2014 Reason for consult: Initial assessment Baby 21 hours of life. Offered to use Arabic interpreter, mom called FOB from waiting room in hospital to interpret. Mom reports baby nursing just fine and she has no questions about BF. Mom nursed at least 3 older children over a year each without any problems. Mom nursed and gave formula and is doing the same with this child. Formula in the room. Mom reports no questions or concerns with breastfeeding at this time. Given LC brochure, aware of OP/BFSG, community resources, and LC help line assistance after D/C. FOB asked for birth registrar number and it was given. Enc mom to call for assistance with BF as needed.   Maternal Data Does the patient have breastfeeding experience prior to this delivery?: Yes  Feeding Feeding Type: Breast Fed Length of feed: 20 min  LATCH Score/Interventions                      Lactation Tools Discussed/Used     Consult Status Consult Status: PRN    Geralynn OchsWILLIARD, Tashae Inda 03/15/2014, 9:04 PM

## 2014-03-15 NOTE — Plan of Care (Signed)
Problem: Phase I Progression Outcomes Goal: OOB as tolerated unless otherwise ordered Outcome: Completed/Met Date Met:  03/15/14  Problem: Phase II Progression Outcomes Goal: Pain controlled on oral analgesia Outcome: Completed/Met Date Met:  03/15/14 Goal: Progress activity as tolerated unless otherwise ordered Outcome: Completed/Met Date Met:  03/15/14 Goal: Afebrile, VS remain stable Outcome: Completed/Met Date Met:  03/15/14 Goal: Tolerating diet Outcome: Completed/Met Date Met:  03/15/14

## 2014-03-16 LAB — RH IG WORKUP (INCLUDES ABO/RH)
ABO/RH(D): O NEG
ANTIBODY SCREEN: POSITIVE
DAT, IgG: NEGATIVE
FETAL SCREEN: NEGATIVE
Gestational Age(Wks): 39
Unit division: 0

## 2014-03-16 NOTE — Plan of Care (Signed)
Problem: Phase II Progression Outcomes Goal: Other Phase II Outcomes/Goals Outcome: Not Applicable Date Met:  03/16/14     

## 2014-03-16 NOTE — Plan of Care (Signed)
Problem: Consults Goal: Postpartum Patient Education (See Patient Education module for education specifics.)  Outcome: Progressing  Problem: Phase II Progression Outcomes Goal: Rh isoimmunization per orders Outcome: Completed/Met Date Met:  03/16/14  Problem: Discharge Progression Outcomes Goal: Barriers To Progression Addressed/Resolved Outcome: Not Applicable Date Met:  25/74/93 Goal: Tolerating diet Outcome: Completed/Met Date Met:  55/21/74 Goal: Complications resolved/controlled Outcome: Not Applicable Date Met:  71/59/53 Goal: Pain controlled with appropriate interventions Outcome: Completed/Met Date Met:  03/16/14

## 2014-03-16 NOTE — Plan of Care (Signed)
Problem: Phase I Progression Outcomes Goal: Other Phase I Outcomes/Goals Outcome: Not Applicable Date Met:  03/16/14     

## 2014-03-16 NOTE — Progress Notes (Addendum)
Subjective: Postpartum Day 1: Vaginal delivery, 1st degree perineal laceration Patient up ad lib, reports no syncope or dizziness. Feeding:  Breast, but baby having issues with spitting Contraceptive plan:  Undecided  Hx precipitous delivery, with no GBS prophylaxis prior to delivery. Unsure regarding circumcision decision--she will discuss with husband.  Objective: Vital signs in last 24 hours: Temp:  [97.8 F (36.6 C)-98.8 F (37.1 C)] 97.8 F (36.6 C) (12/07 0559) Pulse Rate:  [55-74] 55 (12/07 0559) Resp:  [18-20] 18 (12/07 0559) BP: (107-114)/(56-76) 107/56 mmHg (12/07 0559) SpO2:  [99 %-100 %] 99 % (12/06 1825) Weight:  [222 lb (100.699 kg)] 222 lb (100.699 kg) (12/06 2351)  Physical Exam:  General: alert Lochia: appropriate Uterine Fundus: firm Perineum: healing well DVT Evaluation: No evidence of DVT seen on physical exam. Negative Homan's sign.    Recent Labs  03/15/14 0551  HGB 11.5*  HCT 33.1*    Assessment/Plan: Status post vaginal delivery day 1. Stable Continue current care. Plan for discharge tomorrow  LC to see regarding breastfeeding. Will f/u with patient regarding circumcision.    Nyra CapesLATHAM, VICKICNM 03/16/2014, 8:46 AM   Addendum: Spoke with patient and husband upon his arrival. Circumcision planned, but will do as outpatient.  Nigel BridgemanVicki Shelie Lansing, CNM 03/16/14 12:45p

## 2014-03-17 LAB — RPR

## 2014-03-17 MED ORDER — IBUPROFEN 600 MG PO TABS: 600.0000 mg | ORAL_TABLET | Freq: Four times a day (QID) | ORAL | Status: DC

## 2014-03-17 NOTE — Discharge Summary (Signed)
  Vaginal Delivery Discharge Summary  Jessica Cervantes  DOB:    1970-06-19 MRN:    [id number] CSN:    [id number]  Date of admission:                  Mar 14, 2014  Date of discharge:                   Mar 17, 2014  Procedures this admission:   SVD with 1st Degree Laceration, Rhogam  Date of Delivery: Mar 15, 2014  Newborn Data:  Live born female  Birth Weight: 7 lb 10 oz (3459 g) APGAR: 9, 9  Home with mother. Name: Unknown Circumcision Plan: Outpatient  History of Present Illness:  [name] is a 43 y.o. female, [id number], who presents at 52109w3d weeks gestation. [facility]. She was admitted onset of labor. Her pregnancy has been complicated by:  Patient Active Problem List   Diagnosis Date Noted  . First degree perineal laceration during delivery 03/17/2014  . SVD (spontaneous vaginal delivery) 03/15/2014  . 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 Anemia 04/15/2012  . Type o blood, rh negative 02/19/2012  . Cough, persistent 10/10/2011  . AMA (advanced maternal age) multigravida 35+ 08/29/2011  . Hx of gestational diabetes in prior pregnancy, currently pregnant 08/29/2011  . H/O malaria   . Language Barrier (Arabic) 08/14/2008     Hospital Course:  Admitted in active labor at C/C/+1. Negative GBS. Progressed  naturally. Utilized nothing for pain management.  Delivery was performed by J.Tyger Oka, CNM without complication. Patient and baby tolerated the procedure without difficulty, with first degree laceration noted. Infant status was stable and remained in room with mother.  Mother and infant then had an uncomplicated postpartum course, with breast feeding going well. Mom's physical exam was WNL, and she was discharged home in stable condition. Contraception plan was Nuvaring.  She received adequate benefit from po pain  medications.   Feeding:  breast  Contraception:  NuvaRing vaginal inserts  Discharge hemoglobin:  HEMOGLOBIN  Date Value Ref Range Status  03/15/2014 11.5* 12.0 - 15.0 g/dL Final   HCT  Date Value Ref Range Status  03/15/2014 33.1* 36.0 - 46.0 % Final    Discharge Physical Exam:   General: alert, cooperative and no distress Lochia: appropriate Uterine Fundus: firm at umbilicus Incision: N/A DVT Evaluation: No evidence of DVT seen on physical exam. No significant calf/ankle edema.  Intrapartum Procedures: spontaneous vaginal delivery Postpartum Procedures: Rho(D) Ig Complications-Operative and Postpartum: 1st degree perineal laceration  Discharge Diagnoses: Term Pregnancy-delivered  Discharge Information:  Activity:           pelvic rest Diet:                routine Medications: PNV and Iron Condition:      stable Instructions:  Pain Management, Peri-Care, Breastfeeding, Who and When to call for postpartum complications.   Discharge to: home  Follow-up Information    [facility] In 6 weeks.   Specialty:  Obstetrics and Gynecology   Why:  Please call if you have any questions or concerns prior to your next visit.   Contact information:   [address] [place] [phone]     Marlene BastMLY, Consepcion Utt LYNN, MSN, CNM 03/17/2014 9:48 AM

## 2014-03-17 NOTE — Plan of Care (Signed)
Problem: Discharge Progression Outcomes Goal: Activity appropriate for discharge plan Outcome: Completed/Met Date Met:  03/17/14 Goal: Afebrile, VS remain stable at discharge Outcome: Completed/Met Date Met:  03/17/14 Goal: Discharge plan in place and appropriate Outcome: Completed/Met Date Met:  03/17/14 Goal: Other Discharge Outcomes/Goals Outcome: Not Applicable Date Met:  48/18/59

## 2014-03-17 NOTE — Discharge Instructions (Signed)
Postpartum Depression and Baby Blues °The postpartum period begins right after the birth of a baby. During this time, there is often a great amount of joy and excitement. It is also a time of many changes in the life of the parents. Regardless of how many times a mother gives birth, each child brings new challenges and dynamics to the family. It is not unusual to have feelings of excitement along with confusing shifts in moods, emotions, and thoughts. All mothers are at risk of developing postpartum depression or the "baby blues." These mood changes can occur right after giving birth, or they may occur many months after giving birth. The baby blues or postpartum depression can be mild or severe. Additionally, postpartum depression can go away rather quickly, or it can be a long-term condition.  °CAUSES °Raised hormone levels and the rapid drop in those levels are thought to be a main cause of postpartum depression and the baby blues. A number of hormones change during and after pregnancy. Estrogen and progesterone usually decrease right after the delivery of your baby. The levels of thyroid hormone and various cortisol steroids also rapidly drop. Other factors that play a role in these mood changes include major life events and genetics.  °RISK FACTORS °If you have any of the following risks for the baby blues or postpartum depression, know what symptoms to watch out for during the postpartum period. Risk factors that may increase the likelihood of getting the baby blues or postpartum depression include: °· Having a personal or family history of depression.   °· Having depression while being pregnant.   °· Having premenstrual mood issues or mood issues related to oral contraceptives. °· Having a lot of life stress.   °· Having marital conflict.   °· Lacking a social support network.   °· Having a baby with special needs.   °· Having health problems, such as diabetes.   °SIGNS AND SYMPTOMS °Symptoms of baby blues  include: °· Brief changes in mood, such as going from extreme happiness to sadness. °· Decreased concentration.   °· Difficulty sleeping.   °· Crying spells, tearfulness.   °· Irritability.   °· Anxiety.   °Symptoms of postpartum depression typically begin within the first month after giving birth. These symptoms include: °· Difficulty sleeping or excessive sleepiness.   °· Marked weight loss.   °· Agitation.   °· Feelings of worthlessness.   °· Lack of interest in activity or food.   °Postpartum psychosis is a very serious condition and can be dangerous. Fortunately, it is rare. Displaying any of the following symptoms is cause for immediate medical attention. Symptoms of postpartum psychosis include:  °· Hallucinations and delusions.   °· Bizarre or disorganized behavior.   °· Confusion or disorientation.   °DIAGNOSIS  °A diagnosis is made by an evaluation of your symptoms. There are no medical or lab tests that lead to a diagnosis, but there are various questionnaires that a health care provider may use to identify those with the baby blues, postpartum depression, or psychosis. Often, a screening tool called the Edinburgh Postnatal Depression Scale is used to diagnose depression in the postpartum period.  °TREATMENT °The baby blues usually goes away on its own in 1-2 weeks. Social support is often all that is needed. You will be encouraged to get adequate sleep and rest. Occasionally, you may be given medicines to help you sleep.  °Postpartum depression requires treatment because it can last several months or longer if it is not treated. Treatment may include individual or group therapy, medicine, or both to address any social, physiological, and psychological   factors that may play a role in the depression. Regular exercise, a healthy diet, rest, and social support may also be strongly recommended.  Postpartum psychosis is more serious and needs treatment right away. Hospitalization is often needed. HOME CARE  INSTRUCTIONS  Get as much rest as you can. Nap when the baby sleeps.   Exercise regularly. Some women find yoga and walking to be beneficial.   Eat a balanced and nourishing diet.   Do little things that you enjoy. Have a cup of tea, take a bubble bath, read your favorite magazine, or listen to your favorite music.  Avoid alcohol.   Ask for help with household chores, cooking, grocery shopping, or running errands as needed. Do not try to do everything.   Talk to people close to you about how you are feeling. Get support from your partner, family members, friends, or other new moms.  Try to stay positive in how you think. Think about the things you are grateful for.   Do not spend a lot of time alone.   Only take over-the-counter or prescription medicine as directed by your health care provider.  Keep all your postpartum appointments.   Let your health care provider know if you have any concerns.  SEEK MEDICAL CARE IF: You are having a reaction to or problems with your medicine. SEEK IMMEDIATE MEDICAL CARE IF:  You have suicidal feelings.   You think you may harm the baby or someone else. MAKE SURE YOU:  Understand these instructions.  Will watch your condition.  Will get help right away if you are not doing well or get worse. Document Released: 12/30/2003 Document Revised: 04/01/2013 Document Reviewed: 01/06/2013 [facility] Patient Information 2015 [place], [place]. This information is not intended to replace advice given to you by your health care provider. Make sure you discuss any questions you have with your health care provider. Iron-Rich Diet An iron-rich diet contains foods that are good sources of iron. Iron is an important mineral that helps your body produce hemoglobin. Hemoglobin is a protein in red blood cells that carries oxygen to the body's tissues. Sometimes, the iron level in your blood can be low. This may be caused by:  A lack of iron in your  diet.  Blood loss.  Times of growth, such as during pregnancy or during a child's growth and development. Low levels of iron can cause a decrease in the number of red blood cells. This can result in iron deficiency anemia. Iron deficiency anemia symptoms include:  Tiredness.  Weakness.  Irritability.  Increased chance of infection. Here are some recommendations for daily iron intake:  Males older than 42 years of age need 8 mg of iron per day.  Women ages 35 to 63 need 18 mg of iron per day.  Pregnant women need 27 mg of iron per day, and women who are over 66 years of age and breastfeeding need 9 mg of iron per day.  Women over the age of 70 need 8 mg of iron per day. SOURCES OF IRON There are 2 types of iron that are found in food: heme iron and nonheme iron. Heme iron is absorbed by the body better than nonheme iron. Heme iron is found in meat, poultry, and fish. Nonheme iron is found in grains, beans, and vegetables. Heme Iron Sources Food / Iron (mg)  Chicken liver, 3 oz (85 g)/ 10 mg  Beef liver, 3 oz (85 g)/ 5.5 mg  Oysters, 3 oz (85 g)/ 8  mg  Beef, 3 oz (85 g)/ 2 to 3 mg  Shrimp, 3 oz (85 g)/ 2.8 mg  [place], 3 oz (85 g)/ 2 mg  Chicken, 3 oz (85 g) / 1 mg  Fish (tuna, halibut), 3 oz (85 g)/ 1 mg  Pork, 3 oz (85 g)/ 0.9 mg Nonheme Iron Sources Food / Iron (mg)  Ready-to-eat breakfast cereal, iron-fortified / 3.9 to 7 mg  Tofu,  cup / 3.4 mg  Kidney beans,  cup / 2.6 mg  Baked potato with skin / 2.7 mg  Asparagus,  cup / 2.2 mg  Avocado / 2 mg  Dried peaches,  cup / 1.6 mg  Raisins,  cup / 1.5 mg  Soy milk, 1 cup / 1.5 mg  Whole-wheat bread, 1 slice / 1.2 mg  Spinach, 1 cup / 0.8 mg  Broccoli,  cup / 0.6 mg IRON ABSORPTION Certain foods can decrease the body's absorption of iron. Try to avoid these foods and beverages while eating meals with iron-containing foods:  Coffee.  Tea.  Fiber.  Soy. Foods containing vitamin C can  help increase the amount of iron your body absorbs from iron sources, especially from nonheme sources. Eat foods with vitamin C along with iron-containing foods to increase your iron absorption. Foods that are high in vitamin C include many fruits and vegetables. Some good sources are:  Fresh orange juice.  Oranges.  Strawberries.  Mangoes.  Grapefruit.  Red bell peppers.  Green bell peppers.  Broccoli.  Potatoes with skin.  Tomato juice. Document Released: 11/08/2004 Document Revised: 06/19/2011 Document Reviewed: 09/15/2010 [facility] Patient Information 2015 [place]. This information is not intended to replace advice given to you by your health care provider. Make sure you discuss any questions you have with your health care provider. Ethinyl Estradiol; Etonogestrel vaginal ring What is this medicine? ETHINYL ESTRADIOL; ETONOGESTREL (ETH in il es tra DYE ole; et oh noe JES trel) vaginal ring is a flexible, vaginal ring used as a contraceptive (birth control method). This medicine combines two types of female hormones, an estrogen and a progestin. This ring is used to prevent ovulation and pregnancy. Each ring is effective for one month. This medicine may be used for other purposes; ask your health care provider or pharmacist if you have questions. COMMON BRAND NAME(S): NuvaRing What should I tell my health care provider before I take this medicine? They need to know if you have or ever had any of these conditions: -abnormal vaginal bleeding -blood vessel disease or blood clots -breast, cervical, endometrial, ovarian, liver, or uterine cancer -diabetes -gallbladder disease -heart disease or recent heart attack -high blood pressure -high cholesterol -kidney disease -liver disease -migraine headaches -stroke -systemic lupus erythematosus (SLE) -tobacco smoker -an unusual or allergic reaction to estrogens, progestins, other medicines, foods, dyes, or  preservatives -pregnant or trying to get pregnant -breast-feeding How should I use this medicine? Insert the ring into your vagina as directed. Follow the directions on the prescription label. The ring will remain place for 3 weeks and is then removed for a 1-week break. A new ring is inserted 1 week after the last ring was removed, on the same day of the week. Do not use more often than directed. A patient package insert for the product will be given with each prescription and refill. Read this sheet carefully each time. The sheet may change frequently. Contact your pediatrician regarding the use of this medicine in children. Special care may be needed. This medicine has  been used in female children who have started having menstrual periods. Overdosage: If you think you have taken too much of this medicine contact a poison control center or emergency room at once. NOTE: This medicine is only for you. Do not share this medicine with others. What if I miss a dose? You will need to replace your vaginal ring once a month as directed. If the ring should slip out, or if you leave it in longer or shorter than you should, contact your health care professional for advice. What may interact with this medicine? -acetaminophen -antibiotics or medicines for infections, especially rifampin, rifabutin, rifapentine, and griseofulvin, and possibly penicillins or tetracyclines -aprepitant -ascorbic acid (vitamin C) -atorvastatin -barbiturate medicines, such as phenobarbital -bosentan -carbamazepine -caffeine -clofibrate -cyclosporine -dantrolene -doxercalciferol -felbamate -grapefruit juice -hydrocortisone -medicines for anxiety or sleeping problems, such as diazepam or temazepam -medicines for diabetes, including pioglitazone -modafinil -mycophenolate -nefazodone -oxcarbazepine -phenytoin -prednisolone -ritonavir or other medicines for HIV infection or AIDS -rosuvastatin -selegiline -soy  isoflavones supplements -St. John's wort -tamoxifen or raloxifene -theophylline -thyroid hormones -topiramate -warfarin This list may not describe all possible interactions. Give your health care provider a list of all the medicines, herbs, non-prescription drugs, or dietary supplements you use. Also tell them if you smoke, drink alcohol, or use illegal drugs. Some items may interact with your medicine. What should I watch for while using this medicine? Visit your doctor or health care professional for regular checks on your progress. You will need a regular breast and pelvic exam and Pap smear while on this medicine. Use an additional method of contraception during the first cycle that you use this ring. If you have any reason to think you are pregnant, stop using this medicine right away and contact your doctor or health care professional. If you are using this medicine for hormone related problems, it may take several cycles of use to see improvement in your condition. Smoking increases the risk of getting a blood clot or having a stroke while you are using hormonal birth control, especially if you are more than 42 years old. You are strongly advised not to smoke. This medicine can make your body retain fluid, making your fingers, hands, or ankles swell. Your blood pressure can go up. Contact your doctor or health care professional if you feel you are retaining fluid. This medicine can make you more sensitive to the sun. Keep out of the sun. If you cannot avoid being in the sun, wear protective clothing and use sunscreen. Do not use sun lamps or tanning beds/booths. If you wear contact lenses and notice visual changes, or if the lenses begin to feel uncomfortable, consult your eye care specialist. In some women, tenderness, swelling, or minor bleeding of the gums may occur. Notify your dentist if this happens. Brushing and flossing your teeth regularly may help limit this. See your dentist  regularly and inform your dentist of the medicines you are taking. If you are going to have elective surgery, you may need to stop using this medicine before the surgery. Consult your health care professional for advice. This medicine does not protect you against HIV infection (AIDS) or any other sexually transmitted diseases. What side effects may I notice from receiving this medicine? Side effects that you should report to your doctor or health care professional as soon as possible: -breast tissue changes or discharge -changes in vaginal bleeding during your period or between your periods -chest pain -coughing up blood -dizziness or fainting spells -headaches or  migraines -leg, arm or groin pain -severe or sudden headaches -stomach pain (severe) -sudden shortness of breath -sudden loss of coordination, especially on one side of the body -speech problems -symptoms of vaginal infection like itching, irritation or unusual discharge -tenderness in the upper abdomen -vomiting -weakness or numbness in the arms or legs, especially on one side of the body -yellowing of the eyes or skin Side effects that usually do not require medical attention (report to your doctor or health care professional if they continue or are bothersome): -breakthrough bleeding and spotting that continues beyond the 3 initial cycles of pills -breast tenderness -mood changes, anxiety, depression, frustration, anger, or emotional outbursts -increased sensitivity to sun or ultraviolet light -nausea -skin rash, acne, or brown spots on the skin -weight gain (slight) This list may not describe all possible side effects. Call your doctor for medical advice about side effects. You may report side effects to FDA at 1-800-FDA-1088. Where should I keep my medicine? Keep out of the reach of children. Store at room temperature between 15 and 30 degrees C (59 and 86 degrees F) for up to 4 months. The product will expire after 4  months. Protect from light. Throw away any unused medicine after the expiration date. NOTE: This sheet is a summary. It may not cover all possible information. If you have questions about this medicine, talk to your doctor, pharmacist, or health care provider.  2015, [employer] Standard. (2008-03-12 12:03:58)

## 2014-05-12 ENCOUNTER — Other Ambulatory Visit (INDEPENDENT_AMBULATORY_CARE_PROVIDER_SITE_OTHER): Payer: Self-pay | Admitting: Surgery

## 2014-05-20 ENCOUNTER — Encounter (HOSPITAL_COMMUNITY): Payer: Self-pay | Admitting: *Deleted

## 2014-05-20 MED ORDER — CEFAZOLIN SODIUM-DEXTROSE 2-3 GM-% IV SOLR
2.0000 g | INTRAVENOUS | Status: DC
  Filled 2014-05-20: qty 50

## 2014-05-20 NOTE — H&P (Signed)
  Jessica Cervantes 05/12/2014 2:16 PM  Location: [facility] Patient #: [id number] DOB: 28-Sep-1970 Married / Language: Arabic / Race: Refused to Report/Unreported Female  History of Present Illness (Nylen Creque A. Magnus IvanBlackman MD; 05/12/2014 2:26 PM) Patient words: Hernia Repair.  The patient is a 44 year old female who presents with an incisional hernia. She is here for a follow-up visit. I saw her in August with a small incisional hernia. At that time she was pregnant. She now postpartum and wishes to proceed with hernia repair. She has no other issues. She is accompanied by her husband who is her interpreter.  She has discomfort from the hernia but no obstructive symptoms.   Diagnostic Studies History Jake Church(Jason McDowell, LPN; 6/0/45402/05/2014 9:812:16 PM) Pap Smear never  Allergies Jake Church(Jason McDowell, LPN; 1/9/14782/05/2014 2:952:17 PM) No Known Drug Allergies02/05/2014  Medication History Jake Church(Jason McDowell, LPN; 6/2/13082/05/2014 6:572:17 PM) Tylenol Extra Strength (500MG  Tablet, Oral) Active. Multivitamins (Oral) Active.  Social History Jake Church(Jason McDowell, LPN; 8/4/69622/05/2014 9:522:16 PM) No alcohol use  Pregnancy / Birth History Jake Church(Jason McDowell, LPN; 8/4/13242/05/2014 4:012:16 PM) Para 4  Review of Systems Jake Church(Jason McDowell LPN; 0/2/72532/05/2014 6:642:16 PM) Skin Not Present- Change in Wart/Mole, Dryness, Hives, Jaundice, New Lesions, Non-Healing Wounds, Rash and Ulcer. Respiratory Not Present- Bloody sputum, Chronic Cough, Difficulty Breathing, Snoring and Wheezing. Cardiovascular Not Present- Chest Pain, Difficulty Breathing Lying Down, Leg Cramps, Palpitations, Rapid Heart Rate, Shortness of Breath and Swelling of Extremities. Female Genitourinary Not Present- Frequency, Nocturia, Painful Urination, Pelvic Pain and Urgency.   Vitals Jake Church(Jason McDowell LPN; 4/0/34742/05/2014 2:592:18 PM) 05/12/2014 2:17 PM Weight: 206.5 lb Height: 66in Body Surface Area: 2.09 m Body Mass Index: 33.33 kg/m Temp.: 98.38F(Temporal)  Pulse: 74 (Regular)  Resp.: 18  (Unlabored)  BP: 124/82 (Sitting, Left Arm, Standard)    Physical Exam (Sallye Lunz A. Magnus IvanBlackman MD; 05/12/2014 2:26 PM) The physical exam findings are as follows: comfortable Note:On exam, there is a small incisional hernia which is easily reducible above the umbilicus.  Abdomen is soft and non tender Lungs clear CV RRR Skin with no rashes    Assessment & Plan (Jashiya Bassett A. Magnus IvanBlackman MD; 05/12/2014 2:27 PM)  INCISIONAL HERNIA (553.21  K43.2)  Impression: Repair with possible mesh is recommended. I discussed this with the family. I discussed the risk in detail. She understands and wishes to proceed

## 2014-05-20 NOTE — Progress Notes (Signed)
Pt denies SOB, chest pain, and being under the care of a cardiologist. Pt denies having an EKG and chest x ray within the last year. When questioned about a stress test, echo, and cardiac cath, pt stated " I had some test done but not sure what."  Pt made aware to stop taking Aspirin, vitamins, and herbal medications. Do not take any NSAIDs ie: Ibuprofen, Advil, Naproxen or any medication containing Aspirin.

## 2014-05-21 ENCOUNTER — Ambulatory Visit (HOSPITAL_COMMUNITY)
Admission: RE | Admit: 2014-05-21 | Discharge: 2014-05-21 | Disposition: A | Discharge status: Home / Self Care | Payer: 59 | Source: Ambulatory Visit | Attending: Surgery | Admitting: Surgery

## 2014-05-21 ENCOUNTER — Encounter (HOSPITAL_COMMUNITY)
Admission: RE | Disposition: A | Discharge status: Home / Self Care | Payer: Self-pay | Source: Ambulatory Visit | Attending: Surgery

## 2014-05-21 ENCOUNTER — Ambulatory Visit (HOSPITAL_COMMUNITY): Payer: 59 | Admitting: Certified Registered Nurse Anesthetist

## 2014-05-21 ENCOUNTER — Encounter (HOSPITAL_COMMUNITY): Payer: Self-pay | Admitting: Anesthesiology

## 2014-05-21 DIAGNOSIS — K432 Incisional hernia without obstruction or gangrene: Secondary | ICD-10-CM | POA: Insufficient documentation

## 2014-05-21 DIAGNOSIS — Z79899 Other long term (current) drug therapy: Secondary | ICD-10-CM | POA: Diagnosis not present

## 2014-05-21 DIAGNOSIS — E119 Type 2 diabetes mellitus without complications: Secondary | ICD-10-CM | POA: Insufficient documentation

## 2014-05-21 HISTORY — DX: Headache, unspecified: R51.9

## 2014-05-21 HISTORY — DX: Unspecified abdominal hernia without obstruction or gangrene: K46.9

## 2014-05-21 HISTORY — PX: INCISIONAL HERNIA REPAIR: SHX193

## 2014-05-21 HISTORY — PX: INSERTION OF MESH: SHX5868

## 2014-05-21 HISTORY — DX: Headache: R51

## 2014-05-21 LAB — BASIC METABOLIC PANEL
Anion gap: 11 (ref 5–15)
BUN: 12 mg/dL (ref 6–23)
CHLORIDE: 104 mmol/L (ref 96–112)
CO2: 22 mmol/L (ref 19–32)
Calcium: 8.5 mg/dL (ref 8.4–10.5)
Creatinine, Ser: 0.58 mg/dL (ref 0.50–1.10)
GFR calc non Af Amer: >90 mL/min (ref >90)
Glucose, Bld: 112 mg/dL — ABNORMAL HIGH (ref 70–99)
POTASSIUM: 3.5 mmol/L (ref 3.5–5.1)
Sodium: 137 mmol/L (ref 135–145)

## 2014-05-21 LAB — HCG, SERUM, QUALITATIVE: Preg, Serum: NEGATIVE

## 2014-05-21 LAB — CBC
HCT: 39.1 % (ref 36.0–46.0)
HEMOGLOBIN: 12.8 g/dL (ref 12.0–15.0)
MCH: 30.4 pg (ref 26.0–34.0)
MCHC: 32.7 g/dL (ref 30.0–36.0)
MCV: 92.9 fL (ref 78.0–100.0)
Platelets: 254 10*3/uL (ref 150–400)
RBC: 4.21 MIL/uL (ref 3.87–5.11)
RDW: 13.7 % (ref 11.5–15.5)
WBC: 4.5 10*3/uL (ref 4.0–10.5)

## 2014-05-21 LAB — GLUCOSE, CAPILLARY: GLUCOSE-CAPILLARY: 95 mg/dL (ref 70–99)

## 2014-05-21 SURGERY — REPAIR, HERNIA, INCISIONAL
Anesthesia: General | Site: Abdomen

## 2014-05-21 MED ORDER — ONDANSETRON HCL 4 MG/2ML IJ SOLN
INTRAMUSCULAR | Status: DC | PRN
  Administered 2014-05-21: 4 mg via INTRAVENOUS

## 2014-05-21 MED ORDER — FENTANYL CITRATE 0.05 MG/ML IJ SOLN
INTRAMUSCULAR | Status: DC | PRN
  Administered 2014-05-21 (×2): 25 ug via INTRAVENOUS
  Administered 2014-05-21: 250 ug via INTRAVENOUS
  Administered 2014-05-21: 50 ug via INTRAVENOUS

## 2014-05-21 MED ORDER — SODIUM CHLORIDE 0.9 % IJ SOLN: 3.0000 mL | Freq: Two times a day (BID) | INTRAMUSCULAR | Status: DC

## 2014-05-21 MED ORDER — ACETAMINOPHEN 325 MG PO TABS: 650.0000 mg | ORAL_TABLET | ORAL | Status: DC | PRN

## 2014-05-21 MED ORDER — CEFAZOLIN SODIUM-DEXTROSE 2-3 GM-% IV SOLR
INTRAVENOUS | Status: DC | PRN
  Administered 2014-05-21: 2 g via INTRAVENOUS

## 2014-05-21 MED ORDER — LIDOCAINE HCL (CARDIAC) 20 MG/ML IV SOLN
INTRAVENOUS | Status: AC
  Filled 2014-05-21: qty 15

## 2014-05-21 MED ORDER — ROCURONIUM BROMIDE 50 MG/5ML IV SOLN
INTRAVENOUS | Status: AC
  Filled 2014-05-21: qty 1

## 2014-05-21 MED ORDER — BUPIVACAINE-EPINEPHRINE 0.25% -1:200000 IJ SOLN
INTRAMUSCULAR | Status: DC | PRN
  Administered 2014-05-21: 19 mL

## 2014-05-21 MED ORDER — SUCCINYLCHOLINE CHLORIDE 20 MG/ML IJ SOLN
INTRAMUSCULAR | Status: AC
  Filled 2014-05-21: qty 1

## 2014-05-21 MED ORDER — LACTATED RINGERS IV SOLN
INTRAVENOUS | Status: DC
  Administered 2014-05-21: 50 mL/h via INTRAVENOUS

## 2014-05-21 MED ORDER — OXYCODONE HCL 5 MG PO TABS
5.0000 mg | ORAL_TABLET | ORAL | Status: DC | PRN
  Administered 2014-05-21: 10 mg via ORAL

## 2014-05-21 MED ORDER — FENTANYL CITRATE 0.05 MG/ML IJ SOLN
INTRAMUSCULAR | Status: AC
  Filled 2014-05-21: qty 5

## 2014-05-21 MED ORDER — MORPHINE SULFATE 2 MG/ML IJ SOLN: 1.0000 mg | INTRAMUSCULAR | Status: DC | PRN

## 2014-05-21 MED ORDER — SODIUM CHLORIDE 0.9 % IJ SOLN: 3.0000 mL | INTRAMUSCULAR | Status: DC | PRN

## 2014-05-21 MED ORDER — PHENYLEPHRINE 40 MCG/ML (10ML) SYRINGE FOR IV PUSH (FOR BLOOD PRESSURE SUPPORT)
PREFILLED_SYRINGE | INTRAVENOUS | Status: AC
  Filled 2014-05-21: qty 40

## 2014-05-21 MED ORDER — OXYCODONE HCL 5 MG PO TABS
ORAL_TABLET | ORAL | Status: AC
  Filled 2014-05-21: qty 2

## 2014-05-21 MED ORDER — KETOROLAC TROMETHAMINE 30 MG/ML IJ SOLN
INTRAMUSCULAR | Status: DC | PRN
  Administered 2014-05-21: 30 mg via INTRAVENOUS

## 2014-05-21 MED ORDER — BUPIVACAINE-EPINEPHRINE (PF) 0.25% -1:200000 IJ SOLN
INTRAMUSCULAR | Status: AC
  Filled 2014-05-21: qty 30

## 2014-05-21 MED ORDER — PROPOFOL 10 MG/ML IV BOLUS
INTRAVENOUS | Status: AC
  Filled 2014-05-21: qty 20

## 2014-05-21 MED ORDER — ONDANSETRON HCL 4 MG/2ML IJ SOLN
INTRAMUSCULAR | Status: AC
  Filled 2014-05-21: qty 4

## 2014-05-21 MED ORDER — ACETAMINOPHEN 650 MG RE SUPP: 650.0000 mg | RECTAL | Status: DC | PRN

## 2014-05-21 MED ORDER — HYDROCODONE-ACETAMINOPHEN 5-325 MG PO TABS: 1.0000 | ORAL_TABLET | ORAL | Status: DC | PRN

## 2014-05-21 MED ORDER — HYDROMORPHONE HCL 1 MG/ML IJ SOLN
0.2500 mg | INTRAMUSCULAR | Status: DC | PRN
  Administered 2014-05-21 (×3): 0.5 mg via INTRAVENOUS

## 2014-05-21 MED ORDER — SODIUM CHLORIDE 0.9 % IV SOLN: 250.0000 mL | INTRAVENOUS | Status: DC | PRN

## 2014-05-21 MED ORDER — 0.9 % SODIUM CHLORIDE (POUR BTL) OPTIME
TOPICAL | Status: DC | PRN
  Administered 2014-05-21: 1000 mL

## 2014-05-21 MED ORDER — ONDANSETRON HCL 4 MG/2ML IJ SOLN: 4.0000 mg | Freq: Once | INTRAMUSCULAR | Status: DC | PRN

## 2014-05-21 MED ORDER — HYDROMORPHONE HCL 1 MG/ML IJ SOLN
INTRAMUSCULAR | Status: AC
  Filled 2014-05-21: qty 1

## 2014-05-21 SURGICAL SUPPLY — 38 items
BLADE SURG ROTATE 9660 (MISCELLANEOUS) IMPLANT
CANISTER SUCTION 2500CC (MISCELLANEOUS) ×3 IMPLANT
CHLORAPREP W/TINT 26ML (MISCELLANEOUS) ×3 IMPLANT
COVER SURGICAL LIGHT HANDLE (MISCELLANEOUS) ×3 IMPLANT
DRAPE LAPAROSCOPIC ABDOMINAL (DRAPES) ×3 IMPLANT
DRAPE UTILITY XL STRL (DRAPES) IMPLANT
DRSG TEGADERM 4X4.75 (GAUZE/BANDAGES/DRESSINGS) IMPLANT
ELECT CAUTERY BLADE 6.4 (BLADE) ×3 IMPLANT
ELECT REM PT RETURN 9FT ADLT (ELECTROSURGICAL) ×3
ELECTRODE REM PT RTRN 9FT ADLT (ELECTROSURGICAL) ×1 IMPLANT
GAUZE SPONGE 4X4 12PLY STRL (GAUZE/BANDAGES/DRESSINGS) IMPLANT
GLOVE BIO SURGEON STRL SZ7 (GLOVE) ×3 IMPLANT
GLOVE BIOGEL PI IND STRL 7.0 (GLOVE) ×2 IMPLANT
GLOVE BIOGEL PI INDICATOR 7.0 (GLOVE) ×4
GLOVE SURG SIGNA 7.5 PF LTX (GLOVE) ×3 IMPLANT
GOWN STRL REUS W/ TWL LRG LVL3 (GOWN DISPOSABLE) ×1 IMPLANT
GOWN STRL REUS W/ TWL XL LVL3 (GOWN DISPOSABLE) ×1 IMPLANT
GOWN STRL REUS W/TWL LRG LVL3 (GOWN DISPOSABLE) ×2
GOWN STRL REUS W/TWL XL LVL3 (GOWN DISPOSABLE) ×2
KIT BASIN OR (CUSTOM PROCEDURE TRAY) ×3 IMPLANT
KIT ROOM TURNOVER OR (KITS) ×3 IMPLANT
LIQUID BAND (GAUZE/BANDAGES/DRESSINGS) ×3 IMPLANT
MESH VENTRALEX ST 1-7/10 CRC S (Mesh General) ×3 IMPLANT
NEEDLE HYPO 25GX1X1/2 BEV (NEEDLE) ×3 IMPLANT
NS IRRIG 1000ML POUR BTL (IV SOLUTION) ×3 IMPLANT
PACK GENERAL/GYN (CUSTOM PROCEDURE TRAY) ×3 IMPLANT
PAD ARMBOARD 7.5X6 YLW CONV (MISCELLANEOUS) ×3 IMPLANT
STAPLER VISISTAT 35W (STAPLE) IMPLANT
SUT MNCRL AB 4-0 PS2 18 (SUTURE) ×3 IMPLANT
SUT NOVA NAB DX-16 0-1 5-0 T12 (SUTURE) ×3 IMPLANT
SUT PROLENE 0 CT 2 (SUTURE) ×3 IMPLANT
SUT PROLENE 1 CT (SUTURE) IMPLANT
SUT VIC AB 3-0 SH 27 (SUTURE) ×2
SUT VIC AB 3-0 SH 27XBRD (SUTURE) ×1 IMPLANT
SYR CONTROL 10ML LL (SYRINGE) ×3 IMPLANT
TOWEL OR 17X24 6PK STRL BLUE (TOWEL DISPOSABLE) IMPLANT
TOWEL OR 17X26 10 PK STRL BLUE (TOWEL DISPOSABLE) ×3 IMPLANT
TRAY FOLEY CATH 14FRSI W/METER (CATHETERS) IMPLANT

## 2014-05-21 NOTE — Transfer of Care (Signed)
Immediate Anesthesia Transfer of Care Note  Patient: Jessica Cervantes  Procedure(s) Performed: Procedure(s): INCISIONAL HERNIA REPAIR (N/A) INSERTION OF MESH (N/A)  Patient Location: PACU  Anesthesia Type:General  Level of Consciousness: awake, alert  and oriented  Airway & Oxygen Therapy: Patient Spontanous Breathing  Post-op Assessment: Report given to RN and Post -op Vital signs reviewed and stable  Post vital signs: Reviewed and stable  Last Vitals:  Filed Vitals:   05/21/14 0803  BP: 100/63  Pulse: 73  Temp: 36.7 C  Resp: 18    Complications: No apparent anesthesia complications

## 2014-05-21 NOTE — Discharge Instructions (Signed)
[facility] _______[facility]  UMBILICAL OR INGUINAL HERNIA REPAIR: POST OP INSTRUCTIONS  Always review your discharge instruction sheet given to you by the facility where your surgery was performed. IF YOU HAVE DISABILITY OR FAMILY LEAVE FORMS, YOU MUST BRING THEM TO THE OFFICE FOR PROCESSING.   DO NOT GIVE THEM TO YOUR DOCTOR.  1. A  prescription for pain medication may be given to you upon discharge.  Take your pain medication as prescribed, if needed.  If narcotic pain medicine is not needed, then you may take acetaminophen (Tylenol) or ibuprofen (Advil) as needed. 2. Take your usually prescribed medications unless otherwise directed. 3. If you need a refill on your pain medication, please contact your pharmacy.  They will contact our office to request authorization. Prescriptions will not be filled after 5 pm or on week-ends. 4. You should follow a light diet the first 24 hours after arrival home, such as soup and crackers, etc.  Be sure to include lots of fluids daily.  Resume your normal diet the day after surgery. 5. Most patients will experience some swelling and bruising around the umbilicus or in the groin and scrotum.  Ice packs and reclining will help.  Swelling and bruising can take several days to resolve.  6. It is common to experience some constipation if taking pain medication after surgery.  Increasing fluid intake and taking a stool softener (such as Colace) will usually help or prevent this problem from occurring.  A mild laxative (Milk of Magnesia or Miralax) should be taken according to package directions if there are no bowel movements after 48 hours. 7. Unless discharge instructions indicate otherwise, you may remove your bandages 24-48 hours after surgery, and you may shower at that time.  You may have steri-strips (small skin tapes) in place directly over the incision.  These strips should be left on the skin for 7-10 days.  If your surgeon used skin glue on the  incision, you may shower in 24 hours.  The glue will flake off over the next 2-3 weeks.  Any sutures or staples will be removed at the office during your follow-up visit. 8. ACTIVITIES:  You may resume regular (light) daily activities beginning the next day--such as daily self-care, walking, climbing stairs--gradually increasing activities as tolerated.  You may have sexual intercourse when it is comfortable.  Refrain from any heavy lifting or straining until approved by your doctor. a. You may drive when you are no longer taking prescription pain medication, you can comfortably wear a seatbelt, and you can safely maneuver your car and apply brakes. b. RETURN TO WORK:  __________________________________________________________ 9. You should see your doctor in the office for a follow-up appointment approximately 2-3 weeks after your surgery.  Make sure that you call for this appointment within a day or two after you arrive home to insure a convenient appointment time. 10. OTHER INSTRUCTIONS: NO LIFTING MORE THAN 15 POUNDS FOR 3 WEEKS 11. ICE PACK AND IBUPROFEN ALSO FOR PAIN 12. MIRALAX FOR CONSTIPATION DAILY __________________________________________________________________________________________________________________________________________________________________________________________  WHEN TO CALL YOUR DOCTOR: 1. Fever over 101.0 2. Inability to urinate 3. Nausea and/or vomiting 4. Extreme swelling or bruising 5. Continued bleeding from incision. 6. Increased pain, redness, or drainage from the incision  The clinic staff is available to answer your questions during regular business hours.  Please dont hesitate to call and ask to speak to one of the nurses for clinical concerns.  If you have a medical emergency, go to the nearest  emergency room or call 911.  A surgeon from [facility] is always on call at the hospital   [address], [address], [place], [place]  [address]  ?  [address] [address], [place], [place]   [address] [phone] ? [phone] ? FAX [phone] Web site: [email or web address]  What to eat:  For your first meals, you should eat lightly; only small meals initially.  If you do not have nausea, you may eat larger meals.  Avoid spicy, greasy and heavy food.    General Anesthesia, Adult, Care After  Refer to this sheet in the next few weeks. These instructions provide you with information on caring for yourself after your procedure. Your health care provider may also give you more specific instructions. Your treatment has been planned according to current medical practices, but problems sometimes occur. Call your health care provider if you have any problems or questions after your procedure.  WHAT TO EXPECT AFTER THE PROCEDURE  After the procedure, it is typical to experience:  Sleepiness.  Nausea and vomiting. HOME CARE INSTRUCTIONS  For the first 24 hours after general anesthesia:  Have a responsible person with you.  Do not drive a car. If you are alone, do not take public transportation.  Do not drink alcohol.  Do not take medicine that has not been prescribed by your health care provider.  Do not sign important papers or make important decisions.  You may resume a normal diet and activities as directed by your health care provider.  Change bandages (dressings) as directed.  If you have questions or problems that seem related to general anesthesia, call the hospital and ask for the anesthetist or anesthesiologist on call. SEEK MEDICAL CARE IF:  You have nausea and vomiting that continue the day after anesthesia.  You develop a rash. SEEK IMMEDIATE MEDICAL CARE IF:  You have difficulty breathing.  You have chest pain.  You have any allergic problems. Document Released: 07/03/2000 Document Revised: 11/27/2012 Document Reviewed: 10/10/2012  [facility] Patient Information 2014 [place], [place].   Sore Throat    A sore throat  is a painful, burning, sore, or scratchy feeling of the throat. There may be pain or tenderness when swallowing or talking. You may have other symptoms with a sore throat. These include coughing, sneezing, fever, or a swollen neck. A sore throat is often the first sign of another sickness. These sicknesses may include a cold, flu, strep throat, or an infection called mono. Most sore throats go away without medical treatment.  HOME CARE  Only take medicine as told by your doctor.  Drink enough fluids to keep your pee (urine) clear or pale yellow.  Rest as needed.  Try using throat sprays, lozenges, or suck on hard candy (if older than 4 years or as told).  Sip warm liquids, such as broth, herbal tea, or warm water with honey. Try sucking on frozen ice pops or drinking cold liquids.  Rinse the mouth (gargle) with salt water. Mix 1 teaspoon salt with 8 ounces of water.  Do not smoke. Avoid being around others when they are smoking.  Put a humidifier in your bedroom at night to moisten the air. You can also turn on a hot shower and sit in the bathroom for 5-10 minutes. Be sure the bathroom door is closed. GET HELP RIGHT AWAY IF:  You have trouble breathing.  You cannot swallow fluids, soft foods, or your spit (saliva).  You have more puffiness (swelling) in the  throat.  Your sore throat does not get better in 7 days.  You feel sick to your stomach (nauseous) and throw up (vomit).  You have a fever or lasting symptoms for more than 2-3 days.  You have a fever and your symptoms suddenly get worse. MAKE SURE YOU:  Understand these instructions.  Will watch your condition.  Will get help right away if you are not doing well or get worse. Document Released: 01/04/2008 Document Revised: 12/20/2011 Document Reviewed: 12/03/2011  [facility] Patient Information 2015 [place], [place]. This information is not intended to replace advice given to you by your health care provider. Make sure you discuss any  questions you have with your health care provider.

## 2014-05-21 NOTE — Anesthesia Preprocedure Evaluation (Signed)
Anesthesia Evaluation  Patient identified by MRN, date of birth, ID band Patient awake    Reviewed: Allergy & Precautions, NPO status , Patient's Chart, lab work & pertinent test results  Airway        Dental   Pulmonary          Cardiovascular     Neuro/Psych  Headaches,    GI/Hepatic   Endo/Other  diabetes  Renal/GU      Musculoskeletal   Abdominal   Peds  Hematology  (+) anemia ,   Anesthesia Other Findings   Reproductive/Obstetrics                             Anesthesia Physical Anesthesia Plan  ASA: I  Anesthesia Plan: General   Post-op Pain Management:    Induction: Intravenous  Airway Management Planned: Oral ETT  Additional Equipment:   Intra-op Plan:   Post-operative Plan: Extubation in OR  Informed Consent: I have reviewed the patients History and Physical, chart, labs and discussed the procedure including the risks, benefits and alternatives for the proposed anesthesia with the patient or authorized representative who has indicated his/her understanding and acceptance.     Plan Discussed with: CRNA, Anesthesiologist and Surgeon  Anesthesia Plan Comments:         Anesthesia Quick Evaluation

## 2014-05-21 NOTE — Interval H&P Note (Signed)
History and Physical Interval Note: no change in H and P  05/21/2014 8:12 AM  Jessica Cervantes  has presented today for surgery, with the diagnosis of Incisional Hernia  The various methods of treatment have been discussed with the patient and family. After consideration of risks, benefits and other options for treatment, the patient has consented to  Procedure(s): INCISIONAL HERNIA REPAIR WITH MESH (N/A) INSERTION OF MESH (N/A) as a surgical intervention .  The patient's history has been reviewed, patient examined, no change in status, stable for surgery.  I have reviewed the patient's chart and labs.  Questions were answered to the patient's satisfaction.     Rocky Rishel A

## 2014-05-21 NOTE — Anesthesia Procedure Notes (Signed)
Procedure Name: LMA Insertion Date/Time: 05/21/2014 9:41 AM Performed by: Minus LibertyAVENEL, Dezarae Mcclaran Pre-anesthesia Checklist: Patient identified, Timeout performed, Emergency Drugs available, Suction available and Patient being monitored Patient Re-evaluated:Patient Re-evaluated prior to inductionOxygen Delivery Method: Circle system utilized Preoxygenation: Pre-oxygenation with 100% oxygen Intubation Type: IV induction LMA: LMA inserted LMA Size: 4.0 Number of attempts: 1 Placement Confirmation: positive ETCO2,  CO2 detector and breath sounds checked- equal and bilateral Tube secured with: Tape Dental Injury: Teeth and Oropharynx as per pre-operative assessment

## 2014-05-21 NOTE — Anesthesia Postprocedure Evaluation (Signed)
  Anesthesia Post-op Note  Patient: Jessica Cervantes  Procedure(s) Performed: Procedure(s): INCISIONAL HERNIA REPAIR (N/A) INSERTION OF MESH (N/A)  Patient Location: PACU  Anesthesia Type:General  Level of Consciousness: awake, alert , oriented and patient cooperative  Airway and Oxygen Therapy: Patient Spontanous Breathing  Post-op Pain: mild  Post-op Assessment: Post-op Vital signs reviewed, Patient's Cardiovascular Status Stable, Respiratory Function Stable, Patent Airway, No signs of Nausea or vomiting and Pain level controlled  Post-op Vital Signs: stable  Last Vitals:  Filed Vitals:   05/21/14 1045  BP:   Pulse: 58  Temp:   Resp: 13    Complications: No apparent anesthesia complications

## 2014-05-21 NOTE — Op Note (Signed)
INCISIONAL HERNIA REPAIR, INSERTION OF MESH  Procedure Note  Jessica Cervantes 05/21/2014   Pre-op Diagnosis: Incisional Hernia     Post-op Diagnosis: same  Procedure(s): INCISIONAL HERNIA REPAIR INSERTION OF MESH  Surgeon(s): Abigail Miyamotoouglas Shonica Weier, MD  Anesthesia: General  Staff:  Circulator: Pauletta BrownsMegan Day Cavanaugh, RN Relief Circulator: Maureen RalphsSheila C Bowman, RN Scrub Person: Randon Goldsmithebecca L Kretschmaier  Estimated Blood Loss: Minimal                         Donel Osowski A   Date: 05/21/2014  Time: 10:01 AM

## 2014-05-22 ENCOUNTER — Encounter (HOSPITAL_COMMUNITY): Payer: Self-pay | Admitting: Surgery

## 2014-05-22 NOTE — Op Note (Signed)
[name]:  [name], [name]               ACCOUNT NO.:  [id number]  MEDICAL RECORD NO.:  [id number]  LOCATION:  [facility]                         FACILITY:  [facility]  PHYSICIAN:  Abigail Miyamotoouglas Amory Zbikowski, M.D. DATE OF BIRTH:  Apr 14, 1970  DATE OF PROCEDURE:  05/21/2014 DATE OF DISCHARGE:  05/21/2014                              OPERATIVE REPORT   PREOPERATIVE DIAGNOSIS:  Incisional hernia.  POSTOPERATIVE DIAGNOSIS:  Incisional hernia.  PROCEDURE:  Incisional hernia repair with mesh.  SURGEON:  Harmon Pierouglas W Tricia Oaxaca, M.D.  ANESTHESIA:  General and 0.25% Marcaine.  ESTIMATED BLOOD LOSS:  Minimal.  INDICATIONS:  This is a 44 year old female who is now several months postpartum.  She has had a previous laparoscopic cholecystectomy and now presents with a symptomatic incisional hernia in the epigastrium as well as a small hernia at the umbilicus.  She is suffering from hernias for some time.  Decision was made to proceed with hernia repair with mesh.  PROCEDURE IN DETAIL:  [name], identified as correct patient.  She was placed supine on the operating room table and general anesthesia was induced.  Her abdomen was then prepped and draped in usual sterile fashion.  I anesthetized skin with Marcaine and made a small longitudinal incision at the midline between the umbilical port site and the epigastric port site.  I took this down to the fascia which I easily identified.  I was able to reach both the small tiny umbilical hernia defect as well as the much larger defect at the epigastrium.  I freed up the large hernia sac from the epigastric hernia site and excised in its entirety.  There was only omentum in the hernia sac which was reduced back to the abdominal cavity.  I then brought a 4.3 cm round ventral patch onto the field.  I placed it through the fascial opening and pulled it with the stay ties up against the peritoneum.  I then sewed it in place with interrupted  #1 Novafil sutures.  I then cut the stay ties and closed the fascia over the top of the mesh with a figure-of-eight #1 Novafil suture as well.  Good coverage of the fascial defect circumferentially appeared to be achieved.  I then was able to identify the small fascial defect at the umbilicus and closed it with a single figure-of-eight 0 Prolene suture. I then anesthetized the fascia circumferentially with Marcaine. Hemostasis was achieved with cautery.  I then closed subcutaneous tissue with interrupted 3-0 Vicryl sutures and closed the skin with a running 4-0 Monocryl.  Dermabond was then applied.  The patient tolerated the procedure well.  All the counts were correct at the end of procedure.  [name]     Abigail Miyamotoouglas Micheala Morissette, M.D.     DB/MEDQ  D:  05/21/2014  T:  05/22/2014  Job:  [id number]

## 2015-03-21 ENCOUNTER — Ambulatory Visit (INDEPENDENT_AMBULATORY_CARE_PROVIDER_SITE_OTHER): Payer: 59 | Admitting: Family Medicine

## 2015-03-21 VITALS — BP 118/60 | HR 74 | Temp 98.4°F | Resp 18 | Ht 64.0 in | Wt 218.0 lb

## 2015-03-21 DIAGNOSIS — J209 Acute bronchitis, unspecified: Secondary | ICD-10-CM | POA: Diagnosis not present

## 2015-03-21 MED ORDER — AZITHROMYCIN 250 MG PO TABS: ORAL_TABLET | ORAL | Status: DC

## 2015-03-21 MED ORDER — HYDROCODONE-HOMATROPINE 5-1.5 MG/5ML PO SYRP: 5.0000 mL | ORAL_SOLUTION | Freq: Three times a day (TID) | ORAL | Status: DC | PRN

## 2015-03-21 NOTE — Progress Notes (Signed)
   Subjective:    Patient ID: Jessica Cervantes, female    DOB: Jan 31, 1971, 44 y.o.   MRN: [id number] By signing my name below, I, Jessica Cervantes, attest that this documentation has been prepared under the direction and in the presence of Elvina SidleKurt Katyra Tomassetti, MD.  Electronically Signed: Littie Deedsichard Cervantes, Medical Scribe. 03/21/2015. 11:04 AM.  HPI HPI Comments: Jessica Cervantes is a 44 y.o. female who presents to the [facility] complaining of gradual onset cough that started 3 days ago. Patient reports having associated sneezing and headache. The cough does keep her up at night. She denies fever.    Review of Systems  Constitutional: Negative for fever.  HENT: Positive for sneezing.   Respiratory: Positive for cough.   Neurological: Positive for headaches.       Objective:   Physical Exam CONSTITUTIONAL: Well developed/well nourished HEAD: Normocephalic/atraumatic EYES: EOM/PERRL ENMT: Mucous membranes moist; throat erythematous NECK: supple no meningeal signs SPINE: entire spine nontender CV: S1/S2 noted, no murmurs/rubs/gallops noted LUNGS: Rales in the right base. GU: no cva tenderness NEURO: Pt is awake/alert, moves all extremitiesx4 EXTREMITIES: pulses normal, full ROM SKIN: warm, color normal PSYCH: no abnormalities of mood noted        Assessment & Plan:   This chart was scribed in my presence and reviewed by me personally.    ICD-9-CM ICD-10-CM   1. Acute bronchitis, unspecified organism 466.0 J20.9 azithromycin (ZITHROMAX) 250 MG tablet     HYDROcodone-homatropine (HYCODAN) 5-1.5 MG/5ML syrup     Signed, Elvina SidleKurt Addysen Louth, MD

## 2015-03-21 NOTE — Patient Instructions (Signed)

## 2016-04-17 DIAGNOSIS — Z1231 Encounter for screening mammogram for malignant neoplasm of breast: Secondary | ICD-10-CM | POA: Diagnosis not present

## 2016-04-17 DIAGNOSIS — Z01411 Encounter for gynecological examination (general) (routine) with abnormal findings: Secondary | ICD-10-CM | POA: Diagnosis not present

## 2016-05-01 ENCOUNTER — Other Ambulatory Visit: Payer: Self-pay | Admitting: Obstetrics and Gynecology

## 2016-05-01 DIAGNOSIS — N926 Irregular menstruation, unspecified: Secondary | ICD-10-CM | POA: Diagnosis not present

## 2016-06-02 ENCOUNTER — Ambulatory Visit (INDEPENDENT_AMBULATORY_CARE_PROVIDER_SITE_OTHER): Payer: 59 | Admitting: Physician Assistant

## 2016-06-02 VITALS — BP 104/66 | HR 77 | Temp 98.3°F | Resp 16 | Ht 64.0 in | Wt 213.0 lb

## 2016-06-02 DIAGNOSIS — R0981 Nasal congestion: Secondary | ICD-10-CM | POA: Diagnosis not present

## 2016-06-02 DIAGNOSIS — R05 Cough: Secondary | ICD-10-CM

## 2016-06-02 DIAGNOSIS — R51 Headache: Secondary | ICD-10-CM | POA: Diagnosis not present

## 2016-06-02 DIAGNOSIS — R519 Headache, unspecified: Secondary | ICD-10-CM

## 2016-06-02 DIAGNOSIS — R059 Cough, unspecified: Secondary | ICD-10-CM

## 2016-06-02 DIAGNOSIS — R319 Hematuria, unspecified: Secondary | ICD-10-CM

## 2016-06-02 DIAGNOSIS — R5383 Other fatigue: Secondary | ICD-10-CM | POA: Diagnosis not present

## 2016-06-02 LAB — POC MICROSCOPIC URINALYSIS (UMFC): MUCUS RE: ABSENT

## 2016-06-02 LAB — POCT CBC
Granulocyte percent: 55.6 %G (ref 37–80)
HCT, POC: 37 % — AB (ref 37.7–47.9)
Hemoglobin: 13 g/dL (ref 12.2–16.2)
LYMPH, POC: 1.8 (ref 0.6–3.4)
MCH, POC: 30.5 pg (ref 27–31.2)
MCHC: 35.3 g/dL (ref 31.8–35.4)
MCV: 86.6 fL (ref 80–97)
MID (cbc): 0.4 (ref 0–0.9)
MPV: 7.4 fL (ref 0–99.8)
PLATELET COUNT, POC: 352 10*3/uL (ref 142–424)
POC Granulocyte: 2.7 (ref 2–6.9)
POC LYMPH PERCENT: 37.2 %L (ref 10–50)
POC MID %: 72 %M — AB (ref 0–12)
RBC: 4.27 M/uL (ref 4.04–5.48)
RDW, POC: 13.7 %
WBC: 4.9 10*3/uL (ref 4.6–10.2)

## 2016-06-02 LAB — POCT URINALYSIS DIP (MANUAL ENTRY)
BILIRUBIN UA: NEGATIVE
Bilirubin, UA: NEGATIVE
Glucose, UA: NEGATIVE
Leukocytes, UA: NEGATIVE
Nitrite, UA: NEGATIVE
PROTEIN UA: NEGATIVE
SPEC GRAV UA: 1.02
UROBILINOGEN UA: 0.2
pH, UA: 6

## 2016-06-02 MED ORDER — KETOROLAC TROMETHAMINE 60 MG/2ML IM SOLN
60.0000 mg | Freq: Once | INTRAMUSCULAR | Status: AC
  Administered 2016-06-02: 60 mg via INTRAMUSCULAR

## 2016-06-02 MED ORDER — BENZONATATE 100 MG PO CAPS: 100.0000 mg | ORAL_CAPSULE | Freq: Three times a day (TID) | ORAL | 0 refills | Status: DC | PRN

## 2016-06-02 MED ORDER — FLUTICASONE PROPIONATE 50 MCG/ACT NA SUSP: 2.0000 | Freq: Every day | NASAL | 0 refills | Status: DC

## 2016-06-02 NOTE — Progress Notes (Signed)
Jessica Carwinmani M Alverio  MRN: [id number] DOB: [birth year]  Subjective:  Jessica Cervantes is a 46 y.o. female seen in office today for a chief complaint of fatigue x 2 days. Has associated dizziness, headache, dry cough, sore throat, and left ear pain. She had the flu ten days ago but just took tylenol for relief. Denies chest pain, nausea, and vomiting. She has not been drinking a lot of water lately, only 3 or less over the past few weeks. Has tried tylenol for the headache and notes that it works but it keeps returning. Deneis history of anemia, thryoid disorder, or diabetes. She last took ibuprofen or tylenol 3 days ago. Pt is currently on her menstrual cycle.   Review of Systems  HENT: Negative for tinnitus and voice change.   Eyes: Negative for photophobia and visual disturbance.  Neurological: Negative for syncope, speech difficulty and weakness.    Patient Active Problem List   Diagnosis Date Noted  . First degree perineal laceration during delivery 03/17/2014  . SVD (spontaneous vaginal delivery) 03/15/2014  . 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 Anemia 04/15/2012  . Type O blood, Rh negative 02/19/2012  . Cough, persistent 10/10/2011  . AMA (advanced maternal age) multigravida 35+ 08/29/2011  . Hx of gestational diabetes in prior pregnancy, currently pregnant 08/29/2011  . H/O malaria   . Language Barrier (Arabic) 08/14/2008    Current Outpatient Prescriptions on File Prior to Visit  Medication Sig Dispense Refill  . Acetaminophen (TYLENOL PO) Take 2 tablets by mouth every 6 (six) hours as needed (headache).     No current facility-administered medications on file prior to visit.     No Known Allergies   Objective:  BP 104/66   Pulse 77   Temp 98.3 F (36.8 C) (Oral)   Resp 16   Ht 5\' 4"  (1.626 m)   Wt 213 lb (96.6 kg)   SpO2 99%   BMI 36.56 kg/m   Physical Exam  Constitutional: She is oriented to person, place, and time and well-developed, well-nourished, and in no  distress.  HENT:  Head: Normocephalic and atraumatic.  Right Ear: External ear and ear canal normal.  Left Ear: External ear and ear canal normal.  Nose: Mucosal edema (more prominent on left) present.  Mouth/Throat: Uvula is midline and mucous membranes are normal. Posterior oropharyngeal erythema present.  Left TM appears dull  Eyes: Conjunctivae and EOM are normal. Pupils are equal, round, and reactive to light.  Neck: Normal range of motion. Neck supple. No thyroid mass and no thyromegaly present.  Cardiovascular: Normal rate, regular rhythm, normal heart sounds and intact distal pulses.   Pulmonary/Chest: Effort normal and breath sounds normal.  Lymphadenopathy:       Head (right side): No submental, no submandibular, no tonsillar, no preauricular, no posterior auricular and no occipital adenopathy present.       Head (left side): No submental, no submandibular, no tonsillar, no preauricular, no posterior auricular and no occipital adenopathy present.    She has no cervical adenopathy.       Right: No supraclavicular adenopathy present.       Left: No supraclavicular adenopathy present.  Neurological: She is alert and oriented to person, place, and time. She has normal sensation, normal strength, normal reflexes and intact cranial nerves. She displays normal stance. She has a normal Romberg Test and a normal Tandem Gait Test. Gait normal. Gait normal.  Skin: Skin is warm and dry.  Psychiatric: Affect normal.  Vitals reviewed.  Results for orders placed or performed in visit on 06/02/16 (from the past 24 hour(s))  POCT CBC     Status: Abnormal   Collection Time: 06/02/16  9:59 AM  Result Value Ref Range   WBC 4.9 4.6 - 10.2 K/uL   Lymph, poc 1.8 0.6 - 3.4   POC LYMPH PERCENT 37.2 10 - 50 %L   MID (cbc) 0.4 0 - 0.9   POC MID % 72 (A) 0 - 12 %M   POC Granulocyte 2.7 2 - 6.9   Granulocyte percent 55.6 37 - 80 %G   RBC 4.27 4.04 - 5.48 M/uL   Hemoglobin 13.0 12.2 - 16.2 g/dL    HCT, POC 45.4 (A) 09.8 - 47.9 %   MCV 86.6 80 - 97 fL   MCH, POC 30.5 27 - 31.2 pg   MCHC 35.3 31.8 - 35.4 g/dL   RDW, POC 11.9 %   Platelet Count, POC 352 142 - 424 K/uL   MPV 7.4 0 - 99.8 fL  POCT Microscopic Urinalysis ([facility])     Status: Abnormal   Collection Time: 06/02/16 10:28 AM  Result Value Ref Range   WBC,UR,HPF,POC None None WBC/hpf   RBC,UR,HPF,POC Too numerous to count  (A) None RBC/hpf   Bacteria Few (A) None, Too numerous to count   Mucus Absent Absent   Epithelial Cells, UR Per Microscopy None None, Too numerous to count cells/hpf  POCT urinalysis dipstick     Status: Abnormal   Collection Time: 06/02/16 10:29 AM  Result Value Ref Range   Color, UA yellow yellow   Clarity, UA clear clear   Glucose, UA negative negative   Bilirubin, UA negative negative   Ketones, POC UA negative negative   Spec Grav, UA 1.020    Blood, UA large (A) negative   pH, UA 6.0    Protein Ur, POC negative negative   Urobilinogen, UA 0.2    Nitrite, UA Negative Negative   Leukocytes, UA Negative Negative   Orthostatic VS for the past 24 hrs:  BP- Lying Pulse- Lying BP- Sitting Pulse- Sitting BP- Standing at 0 minutes Pulse- Standing at 0 minutes  06/02/16 1004 126/81 72 129/83 74 122/85 91      Assessment and Plan :  1. Fatigue, unspecified type -Labs pending, pt's vital signs and physical exam is reassuring. Likely due to recent dx of flu. - TSH - POCT Microscopic Urinalysis ([facility]) - POCT urinalysis dipstick - POCT CBC - Orthostatic vital signs  2. Intractable headache, unspecified chronicity pattern, unspecified headache type -PE is reassuring, likely due to mix of recovering from the flu and dehydration.  Instructed to seek care immediately if there headache worsens or she develops blurred vision, speech problems, or syncope. - ketorolac (TORADOL) injection 60 mg; Inject 2 mLs (60 mg total) into the muscle once.  3. Hematuria, unspecified type -Pt is currently on  menstrual cycle.  4. Nasal congestion - fluticasone (FLONASE) 50 MCG/ACT nasal spray; Place 2 sprays into both nostrils daily.  Dispense: 16 g; Refill: 0  5. Cough - benzonatate (TESSALON) 100 MG capsule; Take 1-2 capsules (100-200 mg total) by mouth 3 (three) times daily as needed for cough.  Dispense: 40 capsule; Refill: 0  Benjiman Core PA-C  [facility] 06/02/2016 10:41 AM

## 2016-06-02 NOTE — Patient Instructions (Addendum)
Your blood work in house looks good! I believe you are just experiencing a headache which may be related to dehydration and sleeping in a weird position.   I want you to increase water consumption to 64 oz daily. You can use ibuprofen 400mg  every 4-6 hours for pain as soon as headache appears in the future. Do not take any ibuprofen today as you had a shot today which contained a high amount of NSAID in it.  I would also use flonase daily for your nasal congestion as this is causing your ear to have some fluid behind it which can lead to dizziness. You can also take OTC sudafed for the next couple of days to help with congestion relief.  If you start to experience any severe worsening headache, blurred vision, speech problems, or passing out seek care immediately at the ED.      IF you received an x-ray today, you will receive an invoice from [facility]. Please contact [facility] at [phone] with questions or concerns regarding your invoice.   IF you received labwork today, you will receive an invoice from [facility]. Please contact [facility] at [phone] with questions or concerns regarding your invoice.   Our billing staff will not be able to assist you with questions regarding bills from these companies.  You will be contacted with the lab results as soon as they are available. The fastest way to get your results is to activate your My Chart account. Instructions are located on the last page of this paperwork. If you have not heard from us regarding the results in 2 weeks, please contact this office.

## 2016-06-03 LAB — TSH: TSH: 1.63 u[IU]/mL (ref 0.450–4.500)

## 2016-07-20 ENCOUNTER — Encounter (HOSPITAL_COMMUNITY): Payer: Self-pay | Admitting: Obstetrics and Gynecology

## 2020-04-18 ENCOUNTER — Encounter (HOSPITAL_COMMUNITY): Payer: Self-pay | Admitting: Emergency Medicine

## 2020-04-18 ENCOUNTER — Ambulatory Visit (HOSPITAL_COMMUNITY)
Admission: EM | Admit: 2020-04-18 | Discharge: 2020-04-18 | Disposition: A | Discharge status: Home / Self Care | Payer: 59 | Attending: Urgent Care | Admitting: Urgent Care

## 2020-04-18 ENCOUNTER — Other Ambulatory Visit: Payer: Self-pay

## 2020-04-18 DIAGNOSIS — R519 Headache, unspecified: Secondary | ICD-10-CM | POA: Insufficient documentation

## 2020-04-18 DIAGNOSIS — H9201 Otalgia, right ear: Secondary | ICD-10-CM | POA: Diagnosis present

## 2020-04-18 DIAGNOSIS — Z20822 Contact with and (suspected) exposure to covid-19: Secondary | ICD-10-CM | POA: Diagnosis present

## 2020-04-18 DIAGNOSIS — R058 Other specified cough: Secondary | ICD-10-CM | POA: Insufficient documentation

## 2020-04-18 MED ORDER — PSEUDOEPHEDRINE HCL 60 MG PO TABS: 60.0000 mg | ORAL_TABLET | Freq: Three times a day (TID) | ORAL | 0 refills | Status: DC | PRN

## 2020-04-18 MED ORDER — BENZONATATE 100 MG PO CAPS: 100.0000 mg | ORAL_CAPSULE | Freq: Three times a day (TID) | ORAL | 0 refills | Status: DC | PRN

## 2020-04-18 MED ORDER — CETIRIZINE HCL 10 MG PO TABS: 10.0000 mg | ORAL_TABLET | Freq: Every day | ORAL | 0 refills | Status: DC

## 2020-04-18 MED ORDER — PROMETHAZINE-DM 6.25-15 MG/5ML PO SYRP: 5.0000 mL | ORAL_SOLUTION | Freq: Every evening | ORAL | 0 refills | Status: DC | PRN

## 2020-04-18 NOTE — Discharge Instructions (Addendum)

## 2020-04-18 NOTE — ED Triage Notes (Signed)
Pt presents with headache, left ear pain, and dry cough at night xs 4-5 days. States husband is COVID positive and had symptoms for 7 days. Taking tylenol Q6H for headache and pain.

## 2020-04-18 NOTE — ED Provider Notes (Signed)
[facility]   MRN: [id number] DOB: 27-Jan-1971  Subjective:   Jessica Cervantes is a 50 y.o. female presenting for 5-day history of persistent cough, headache, right ear pain, sinus congestion.  Cough is worse at night.  Patient's husband is COVID-positive.  She has only had 1 dose of the vaccine and is due again in a week for second dose.  Denies chest pain, shortness of breath.  No current facility-administered medications for this encounter.  Current Outpatient Medications:  .  Acetaminophen (TYLENOL PO), Take 2 tablets by mouth every 6 (six) hours as needed (headache)., Disp: , Rfl:  .  benzonatate (TESSALON) 100 MG capsule, Take 1-2 capsules (100-200 mg total) by mouth 3 (three) times daily as needed for cough., Disp: 40 capsule, Rfl: 0 .  fluticasone (FLONASE) 50 MCG/ACT nasal spray, Place 2 sprays into both nostrils daily., Disp: 16 g, Rfl: 0   No Known Allergies  Past Medical History:  Diagnosis Date  . AMA (advanced maternal age) multigravida 35+   . Anemia   . Female circumcision   . FH: diabetes mellitus   . GBS carrier   . Gestational diabetes   . H/O constipation 04/07/09  . H/O hemorrhoids 09/02/09  . H/O malaria   . H/O malaria   . Headache   . Hernia, abdominal   . History of anemia   . Rh negative, maternal   . Type O blood, Rh negative 02/19/2012     Past Surgical History:  Procedure Laterality Date  . GALLBLADDER SURGERY  2010  . INCISIONAL HERNIA REPAIR N/A 05/21/2014   Procedure: INCISIONAL HERNIA REPAIR;  Surgeon: Abigail Miyamoto, MD;  Location: [facility] OR;  Service: General;  Laterality: N/A;  . INSERTION OF MESH N/A 05/21/2014   Procedure: INSERTION OF MESH;  Surgeon: Abigail Miyamoto, MD;  Location: [facility] OR;  Service: General;  Laterality: N/A;  . UPPER GI ENDOSCOPY      Family History  Problem Relation Age of Onset  . Diabetes Mother     Social History   Tobacco Use  . Smoking status: Never Smoker  . Smokeless tobacco: Never  Used  Substance Use Topics  . Alcohol use: No  . Drug use: No    ROS   Objective:   Vitals: BP 128/89 (BP Location: Right Arm)   Pulse 70   Temp 98.3 F (36.8 C) (Oral)   Resp 17   SpO2 100%   Physical Exam Constitutional:      General: She is not in acute distress.    Appearance: Normal appearance. She is well-developed. She is not ill-appearing, toxic-appearing or diaphoretic.  HENT:     Head: Normocephalic and atraumatic.     Right Ear: Tympanic membrane and ear canal normal. No drainage or tenderness. No middle ear effusion. Tympanic membrane is not erythematous.     Left Ear: Tympanic membrane and ear canal normal. No drainage or tenderness.  No middle ear effusion. Tympanic membrane is not erythematous.     Nose: Nose normal. No congestion or rhinorrhea.     Mouth/Throat:     Mouth: Mucous membranes are moist. No oral lesions.     Pharynx: No pharyngeal swelling, oropharyngeal exudate, posterior oropharyngeal erythema or uvula swelling.     Tonsils: No tonsillar exudate or tonsillar abscesses.  Eyes:     General: No scleral icterus.       Right eye: No discharge.        Left eye: No discharge.  Extraocular Movements: Extraocular movements intact.     Right eye: Normal extraocular motion.     Left eye: Normal extraocular motion.     Conjunctiva/sclera: Conjunctivae normal.     Pupils: Pupils are equal, round, and reactive to light.  Cardiovascular:     Rate and Rhythm: Normal rate and regular rhythm.     Pulses: Normal pulses.     Heart sounds: Normal heart sounds. No murmur heard. No friction rub. No gallop.   Pulmonary:     Effort: Pulmonary effort is normal. No respiratory distress.     Breath sounds: Normal breath sounds. No stridor. No wheezing, rhonchi or rales.  Musculoskeletal:     Cervical back: Normal range of motion and neck supple.  Lymphadenopathy:     Cervical: No cervical adenopathy.  Skin:    General: Skin is warm and dry.     Findings:  No rash.  Neurological:     General: No focal deficit present.     Mental Status: She is alert and oriented to person, place, and time.     Cranial Nerves: No cranial nerve deficit.     Motor: No weakness.     Coordination: Coordination normal.     Gait: Gait normal.  Psychiatric:        Mood and Affect: Mood normal.        Speech: Speech normal.        Behavior: Behavior normal.        Thought Content: Thought content normal.     Assessment and Plan :   PDMP not reviewed this encounter.  1. Cough with exposure to COVID-19 virus   2. Generalized headache   3. Ear pain, right     Patient very likely has COVID-19 given her exposure.  She is symptomatic and recommended supportive care.  Patient has normal neurologic exam and I reassured her that I do not suspect she is having a stroke or severe intracranial process.  However, I did recommend that they report to the emergency room for head CT scan if this headache persists and/or they would like to pursue CT scan. Counseled patient on potential for adverse effects with medications prescribed/recommended today, ER and return-to-clinic precautions discussed, patient verbalized understanding.    Wallis Bamberg, [place] 04/18/20 4825

## 2020-04-19 LAB — SARS CORONAVIRUS 2 (TAT 6-24 HRS): SARS Coronavirus 2: NEGATIVE

## 2021-10-03 ENCOUNTER — Emergency Department (HOSPITAL_COMMUNITY): Payer: 59

## 2021-10-03 ENCOUNTER — Emergency Department (HOSPITAL_COMMUNITY)
Admission: EM | Admit: 2021-10-03 | Discharge: 2021-10-03 | Disposition: A | Discharge status: Home / Self Care | Payer: 59 | Attending: Student | Admitting: Student

## 2021-10-03 ENCOUNTER — Encounter (HOSPITAL_COMMUNITY): Payer: Self-pay | Admitting: Emergency Medicine

## 2021-10-03 DIAGNOSIS — I10 Essential (primary) hypertension: Secondary | ICD-10-CM | POA: Diagnosis not present

## 2021-10-03 DIAGNOSIS — M545 Low back pain, unspecified: Secondary | ICD-10-CM

## 2021-10-03 DIAGNOSIS — N3001 Acute cystitis with hematuria: Secondary | ICD-10-CM | POA: Diagnosis not present

## 2021-10-03 LAB — URINALYSIS, ROUTINE W REFLEX MICROSCOPIC
Bilirubin Urine: NEGATIVE
Glucose, UA: NEGATIVE mg/dL
Ketones, ur: NEGATIVE mg/dL
Nitrite: NEGATIVE
Protein, ur: NEGATIVE mg/dL
Specific Gravity, Urine: 1.018 (ref 1.005–1.030)
pH: 5 (ref 5.0–8.0)

## 2021-10-03 LAB — PREGNANCY, URINE: Preg Test, Ur: NEGATIVE

## 2021-10-03 MED ORDER — IBUPROFEN 600 MG PO TABS: 600.0000 mg | ORAL_TABLET | Freq: Four times a day (QID) | ORAL | 0 refills | Status: DC | PRN

## 2021-10-03 MED ORDER — KETOROLAC TROMETHAMINE 30 MG/ML IJ SOLN
15.0000 mg | Freq: Once | INTRAMUSCULAR | Status: AC
  Administered 2021-10-03: 15 mg via INTRAMUSCULAR
  Filled 2021-10-03: qty 1

## 2021-10-03 MED ORDER — METHOCARBAMOL 500 MG PO TABS: 500.0000 mg | ORAL_TABLET | Freq: Two times a day (BID) | ORAL | 0 refills | Status: DC

## 2021-10-03 MED ORDER — NITROFURANTOIN MONOHYD MACRO 100 MG PO CAPS: 100.0000 mg | ORAL_CAPSULE | Freq: Two times a day (BID) | ORAL | 0 refills | Status: DC

## 2021-10-03 MED ORDER — LIDOCAINE 5 % EX PTCH
2.0000 | MEDICATED_PATCH | CUTANEOUS | Status: DC
  Administered 2021-10-03: 2 via TRANSDERMAL
  Filled 2021-10-03 (×2): qty 2

## 2021-10-03 NOTE — ED Notes (Signed)
Pt ambulatory without assistance.  

## 2021-10-03 NOTE — ED Triage Notes (Signed)
Patient c/o back pain that worsens with movement. Denies trauma/injury. Denies urinary symptoms.

## 2021-10-03 NOTE — ED Provider Notes (Addendum)
[facility]-EMERGENCY DEPT Provider Note   CSN: [id number] Arrival date & time: 10/03/21  1918     History  Chief Complaint  Patient presents with   Back Pain    Jessica Cervantes is a 51 y.o. female.   Back Pain Associated symptoms: dysuria   Associated symptoms: no abdominal pain, no chest pain and no fever    51 year old female presents emergency department with complaints of back pain.  Patient states that she has had intermittent back pain for the past couple years.  It usually last for 1 to 2 days, but this time it has been 3 to 4 days of symptoms.  Pain is worsened with physical exertion and twisting motions of back.  She notes no mechanical trauma including fall, MVC, blunt injury to affected area.  She does note pain after moving items in the kitchen 3 to 4 days ago.  Denies fever, saddle anesthesia, bowel/bladder dysfunction, sensory deficits of lower extremities, weakness lower extremities, IV drug use, recent steroid use, known cancer diagnosis.  She secondarily endorses 1 day of dysuria.  Denies chest pain, shortness of breath, cough, abdominal pain, nausea/vomiting/diarrhea, vaginal discharge/bleeding.   Past Medical History:  Diagnosis Date   AMA (advanced maternal age) multigravida 35+    Anemia    Female circumcision    FH: diabetes mellitus    GBS carrier    Gestational diabetes    H/O constipation 04/07/09   H/O hemorrhoids 09/02/09   H/O malaria    H/O malaria    Headache    Hernia, abdominal    History of anemia    Rh negative, maternal    Type O blood, Rh negative 02/19/2012   Past Surgical History:  Procedure Laterality Date   GALLBLADDER SURGERY  2010   INCISIONAL HERNIA REPAIR N/A 05/21/2014   Procedure: INCISIONAL HERNIA REPAIR;  Surgeon: Abigail Miyamoto, MD;  Location: [facility] OR;  Service: General;  Laterality: N/A;   INSERTION OF MESH N/A 05/21/2014   Procedure: INSERTION OF MESH;  Surgeon: Abigail Miyamoto, MD;  Location: [facility] OR;   Service: General;  Laterality: N/A;   UPPER GI ENDOSCOPY      Home Medications Prior to Admission medications   Medication Sig Start Date End Date Taking? Authorizing Provider  ibuprofen (ADVIL) 600 MG tablet Take 1 tablet (600 mg total) by mouth every 6 (six) hours as needed. 10/03/21  Yes Sherian Maroon A, PA  methocarbamol (ROBAXIN) 500 MG tablet Take 1 tablet (500 mg total) by mouth 2 (two) times daily. 10/03/21  Yes Sherian Maroon A, PA  nitrofurantoin, macrocrystal-monohydrate, (MACROBID) 100 MG capsule Take 1 capsule (100 mg total) by mouth 2 (two) times daily. 10/03/21  Yes Sherian Maroon A, PA  Acetaminophen (TYLENOL PO) Take 2 tablets by mouth every 6 (six) hours as needed (headache).    [provider]  benzonatate (TESSALON) 100 MG capsule Take 1-2 capsules (100-200 mg total) by mouth 3 (three) times daily as needed. 04/18/20   Wallis Bamberg, PA-C  cetirizine (ZYRTEC ALLERGY) 10 MG tablet Take 1 tablet (10 mg total) by mouth daily. 04/18/20   Wallis Bamberg, PA-C  fluticasone (FLONASE) 50 MCG/ACT nasal spray Place 2 sprays into both nostrils daily. 06/02/16   Benjiman Core D, PA-C  promethazine-dextromethorphan (PROMETHAZINE-DM) 6.25-15 MG/5ML syrup Take 5 mLs by mouth at bedtime as needed for cough. 04/18/20   Wallis Bamberg, PA-C  pseudoephedrine (SUDAFED) 60 MG tablet Take 1 tablet (60 mg total) by mouth every 8 (eight)  hours as needed for congestion. 04/18/20   Wallis Bamberg, PA-C      Allergies    Patient has no known allergies.    Review of Systems   Review of Systems  Constitutional:  Negative for chills and fever.  HENT:  Negative for ear pain and sore throat.   Eyes:  Negative for pain and visual disturbance.  Respiratory:  Negative for cough and shortness of breath.   Cardiovascular:  Negative for chest pain and palpitations.  Gastrointestinal:  Negative for abdominal pain and vomiting.  Genitourinary:  Positive for dysuria. Negative for frequency and hematuria.   Musculoskeletal:  Positive for back pain. Negative for arthralgias.  Skin:  Negative for color change and rash.  Neurological:  Negative for seizures and syncope.  All other systems reviewed and are negative.   Physical Exam Updated Vital Signs BP (!) 140/97 (BP Location: Right Arm)   Pulse 69   Temp 98.2 F (36.8 C) (Oral)   Resp 17   SpO2 99%  Physical Exam Vitals and nursing note reviewed.  Constitutional:      General: She is not in acute distress.    Appearance: Normal appearance. She is well-developed. She is obese. She is not ill-appearing.  HENT:     Head: Normocephalic and atraumatic.     Nose: Nose normal.     Mouth/Throat:     Mouth: Mucous membranes are moist.     Pharynx: Oropharynx is clear. No posterior oropharyngeal erythema.  Eyes:     General:        Right eye: No discharge.        Left eye: No discharge.     Extraocular Movements: Extraocular movements intact.     Conjunctiva/sclera: Conjunctivae normal.  Cardiovascular:     Rate and Rhythm: Normal rate and regular rhythm.     Heart sounds: No murmur heard. Pulmonary:     Effort: Pulmonary effort is normal. No respiratory distress.     Breath sounds: Normal breath sounds.  Abdominal:     General: Abdomen is flat.     Palpations: Abdomen is soft.     Tenderness: There is no abdominal tenderness. There is no right CVA tenderness or left CVA tenderness.  Musculoskeletal:        General: No swelling.     Cervical back: Normal and neck supple.     Thoracic back: Normal.     Lumbar back: No spasms. Normal range of motion.     Right lower leg: No edema.     Left lower leg: No edema.     Comments: Tender to palpation of midline lumbar back with paraspinal tenderness in the area.  She also has tenderness of bilateral glutes.  Straight leg raise negative bilaterally.  Posterior tibial pulses full and intact bilaterally.  No sensory deficits along major nerve distributions of lower extremities.  Strength 5  out of 5.  Patient has full range of motion.  DTRs symmetric and equal bilaterally.  No radiation of pain noted on exam.  Skin:    General: Skin is warm and dry.     Capillary Refill: Capillary refill takes less than 2 seconds.  Neurological:     Mental Status: She is alert.  Psychiatric:        Mood and Affect: Mood normal.     ED Results / Procedures / Treatments   Labs (all labs ordered are listed, but only abnormal results are displayed) Labs Reviewed  URINALYSIS, ROUTINE W  REFLEX MICROSCOPIC - Abnormal; Notable for the following components:      Result Value   APPearance HAZY (*)    Hgb urine dipstick SMALL (*)    Leukocytes,Ua LARGE (*)    Bacteria, UA RARE (*)    All other components within normal limits  PREGNANCY, URINE    EKG None  Radiology DG Lumbar Spine Complete  Result Date: 10/03/2021 CLINICAL DATA:  Back pain. EXAM: LUMBAR SPINE - COMPLETE 4+ VIEW COMPARISON:  None Available. FINDINGS: There is no evidence of lumbar spine fracture. Alignment is normal. Intervertebral disc spaces are maintained. There are surgical clips in the right upper quadrant. IMPRESSION: Negative. Electronically Signed   By: Darliss Cheney M.D.   On: 10/03/2021 20:42    Procedures Procedures    Medications Ordered in ED Medications  lidocaine (LIDODERM) 5 % 2 patch (2 patches Transdermal Patch Applied 10/03/21 2019)  ketorolac (TORADOL) 30 MG/ML injection 15 mg (15 mg Intramuscular Given 10/03/21 2017)    ED Course/ Medical Decision Making/ A&P                           Medical Decision Making Amount and/or Complexity of Data Reviewed Labs: ordered. Radiology: ordered.  Risk Prescription drug management.   This patient presents to the ED for concern of back pain, this involves an extensive number of treatment options, and is a complaint that carries with it a high risk of complications and morbidity.  The differential diagnosis includes The emergent differential diagnosis for  back pain includes but is not limited to fracture, muscle strain, cauda equina, spinal stenosis. DDD, ankylosing spondylitis, acute ligamentous injury, disk herniation, spondylolisthesis, Epidural compression syndrome, metastatic cancer, transverse myelitis, vertebral osteomyelitis, diskitis, kidney stone, pyelonephritis, AAA, Perforated ulcer, Retrocecal appendicitis, pancreatitis, bowel obstruction, retroperitoneal hemorrhage or mass, meningitis.   Co morbidities that complicate the patient evaluation  Obesity, headache, anemia   Additional history obtained:  Additional history obtained from husband who is at bedside External records from outside source obtained and reviewed including hemoglobin of 13 from 06/02/2016   Lab Tests:  I Ordered, and personally interpreted labs.  The pertinent results include: UA significant for large leukocyte, 21-50 WBC and rare bacteria with 0-5 squamous epithelial cells.   Imaging Studies ordered:  I ordered imaging studies including lumbar x-ray I independently visualized and interpreted imaging which showed no acute abnormality I agree with the radiologist interpretation   Cardiac Monitoring: / EKG:  The patient was maintained on a cardiac monitor.  I personally viewed and interpreted the cardiac monitored which showed an underlying rhythm of: Sinus rhythm   Consultations Obtained:  N/a   Problem List / ED Course / Critical interventions / Medication management  Back pain I ordered medication including Toradol and Lidoderm for pain   Reevaluation of the patient after these medicines showed that the patient improved I have reviewed the patients home medicines and have made adjustments as needed   Social Determinants of Health:  Denies tobacco, alcohol, illicit drug use   Test / Admission - Considered:  Back pain Vitals signs significant for hypertension with a blood pressure 140/97.  Close follow-up PCP recommended. Otherwise  within normal range and stable throughout visit. Laboratory/imaging studies significant for: Urinary tract infection. Back pain most likely secondary to muscular strain.  Will prescribe ibuprofen, muscle laxer, Lidoderm patches to take/use as needed.  Recommend close follow-up with PCP regarding symptoms.  Red flag signs were discussed at  length to patient and husband are both aware of the most worrisome signs.  No concern at this time for pyelonephritis given lack of fever and CVA tenderness.  Doubt cauda equina, spinal stenosis given lack of appropriate symptoms.  Doubt kidney stone secondary to clinical suspicion given HPI and reassuring PE. Worrisome signs and symptoms were discussed with the patient, and the patient acknowledged understanding to return to the ED if noticed. Patient was stable upon discharge.          Final Clinical Impression(s) / ED Diagnoses Final diagnoses:  Acute bilateral low back pain without sciatica  Acute cystitis with hematuria    Rx / DC Orders ED Discharge Orders          Ordered    methocarbamol (ROBAXIN) 500 MG tablet  2 times daily        10/03/21 2132    ibuprofen (ADVIL) 600 MG tablet  Every 6 hours PRN        10/03/21 2132    nitrofurantoin, macrocrystal-monohydrate, (MACROBID) 100 MG capsule  2 times daily        10/03/21 2132              Peter Garter, [place] 10/03/21 2135    Peter Garter, [place] 10/03/21 2137    Glendora Score, MD 10/04/21 2322

## 2022-06-03 ENCOUNTER — Ambulatory Visit (HOSPITAL_COMMUNITY)
Admission: EM | Admit: 2022-06-03 | Discharge: 2022-06-03 | Disposition: A | Discharge status: Home / Self Care | Payer: 59 | Attending: Physician Assistant | Admitting: Physician Assistant

## 2022-06-03 ENCOUNTER — Other Ambulatory Visit: Payer: Self-pay

## 2022-06-03 ENCOUNTER — Encounter (HOSPITAL_COMMUNITY): Payer: Self-pay | Admitting: *Deleted

## 2022-06-03 DIAGNOSIS — J40 Bronchitis, not specified as acute or chronic: Secondary | ICD-10-CM | POA: Diagnosis not present

## 2022-06-03 DIAGNOSIS — J329 Chronic sinusitis, unspecified: Secondary | ICD-10-CM

## 2022-06-03 MED ORDER — AMOXICILLIN-POT CLAVULANATE 875-125 MG PO TABS: 1.0000 | ORAL_TABLET | Freq: Two times a day (BID) | ORAL | 0 refills | Status: DC

## 2022-06-03 MED ORDER — PREDNISONE 20 MG PO TABS: 40.0000 mg | ORAL_TABLET | Freq: Every day | ORAL | 0 refills | Status: AC

## 2022-06-03 MED ORDER — PROMETHAZINE-DM 6.25-15 MG/5ML PO SYRP: 5.0000 mL | ORAL_SOLUTION | Freq: Every evening | ORAL | 0 refills | Status: DC | PRN

## 2022-06-03 NOTE — ED Triage Notes (Signed)
Pt reports a cough ,sore throat and nasal congestion for 9 days.

## 2022-06-03 NOTE — Discharge Instructions (Addendum)
Start Augmentin twice daily to cover for infection.  Take prednisone 40 mg for 4 days.  Do not take NSAIDs with this medication including aspirin, ibuprofen/Advil, naproxen/Aleve.  You can use acetaminophen/Tylenol as well as other over-the-counter medications such as Mucinex and Flonase.  Make sure that you rest and drink plenty of fluid.  You can use nasal saline/sinus rinses for additional symptom relief.  If your symptoms are not improving within a week return for reevaluation.  If anything worsens you should be seen immediately including chest pain, shortness of breath, worsening cough, nausea/vomiting interfering with oral intake.

## 2022-06-03 NOTE — ED Provider Notes (Signed)
[facility]    CSN: [id number] Arrival date & time: 06/03/22  1644      History   Chief Complaint Chief Complaint  Patient presents with   Sore Throat   Cough   Nasal Congestion    HPI Kamilah Beier Gurka is a 52 y.o. female.   Patient presents today companied by her husband who provides majority of history.  Reports a 9 to 10-day history of URI symptoms including cough, sore throat, nasal congestion.  Denies any chest pain, shortness of breath, fever, nausea, vomiting, diarrhea.  Denies any known sick contacts.  She has tried multiple over-the-counter medications without improvement of symptoms.  Denies significant past medical history including allergies, asthma, COPD, smoking.  Denies any recent antibiotics or steroids.  She is having difficulty sleeping at night as a result of her symptoms.    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   Anemia 04/15/2012   Type O blood, Rh negative 02/19/2012   Cough, persistent 10/10/2011   AMA (advanced maternal age) multigravida 35+ 08/29/2011   Hx of gestational diabetes in prior pregnancy, currently pregnant 08/29/2011   H/O malaria    Language Barrier (Arabic) 08/14/2008    Past Surgical History:  Procedure Laterality Date   GALLBLADDER SURGERY  2010   INCISIONAL HERNIA REPAIR N/A 05/21/2014   Procedure: INCISIONAL HERNIA REPAIR;  Surgeon: Coralie Keens, MD;  Location: [facility];  Service: General;  Laterality: N/A;   INSERTION OF MESH N/A 05/21/2014   Procedure: INSERTION OF MESH;  Surgeon: Coralie Keens, MD;  Location: [facility];  Service: General;  Laterality: N/A;   UPPER GI ENDOSCOPY      OB History     Gravida  5   Para  4   Term  4   Preterm      AB  1   Living  4      SAB  1   IAB      Ectopic      Multiple  0   Live Births  4            Home Medications    Prior to Admission medications   Medication Sig Start Date End Date Taking? Authorizing Provider  amoxicillin-clavulanate (AUGMENTIN) 875-125 MG tablet Take 1 tablet by mouth every 12 (twelve) hours. 06/03/22  Yes Verneda Hollopeter K, PA-C  predniSONE (DELTASONE) 20 MG tablet Take 2 tablets (40 mg total) by mouth daily for 4 days. 06/03/22 06/07/22 Yes Ireene Ballowe K, PA-C  promethazine-dextromethorphan (PROMETHAZINE-DM) 6.25-15 MG/5ML syrup Take 5 mLs by mouth at bedtime as needed for cough. 06/03/22   Basma Buchner, Derry Skill, PA-C    Family History Family History  Problem Relation Age of Onset   Diabetes Mother     Social History Social History   Tobacco Use  Smoking status: Never   Smokeless tobacco: Never  Substance Use Topics   Alcohol use: No   Drug use: No     Allergies   Patient has no known allergies.   Review of Systems Review of Systems  Constitutional:  Positive for activity change and fatigue. Negative for appetite change and fever.  HENT:  Positive for congestion, postnasal drip, sinus pressure and sore throat. Negative for sneezing.   Respiratory:  Positive for cough. Negative for shortness of breath.   Cardiovascular:  Negative for chest pain.  Gastrointestinal:  Negative for abdominal pain, diarrhea, nausea and vomiting.  Neurological:  Positive for headaches. Negative for dizziness and light-headedness.     Physical Exam Triage Vital Signs ED Triage Vitals  Enc Vitals Group     BP 06/03/22 1806 119/78     Pulse Rate 06/03/22 1806 73     Resp  06/03/22 1806 20     Temp 06/03/22 1806 98.2 F (36.8 C)     Temp src --      SpO2 06/03/22 1806 97 %     Weight --      Height --      Head Circumference --      Peak Flow --      Pain Score 06/03/22 1804 8     Pain Loc --      Pain Edu? --      Excl. in [place]? --    No data found.  Updated Vital Signs BP 119/78   Pulse 73   Temp 98.2 F (36.8 C)   Resp 20   SpO2 97%   Visual Acuity Right Eye Distance:   Left Eye Distance:   Bilateral Distance:    Right Eye Near:   Left Eye Near:    Bilateral Near:     Physical Exam Vitals reviewed.  Constitutional:      General: She is awake. She is not in acute distress.    Appearance: Normal appearance. She is well-developed. She is not ill-appearing.     Comments: Very pleasant female appears stated age in no acute distress sitting comfortably in exam room  HENT:     Head: Normocephalic and atraumatic.     Right Ear: Tympanic membrane, ear canal and external ear normal. Tympanic membrane is not erythematous or bulging.     Left Ear: Tympanic membrane, ear canal and external ear normal. Tympanic membrane is not erythematous or bulging.     Nose:     Right Sinus: Maxillary sinus tenderness present. No frontal sinus tenderness.     Left Sinus: Maxillary sinus tenderness present. No frontal sinus tenderness.     Mouth/Throat:     Pharynx: Uvula midline. Posterior oropharyngeal erythema present. No oropharyngeal exudate.  Cardiovascular:     Rate and Rhythm: Normal rate and regular rhythm.     Heart sounds: Normal heart sounds, S1 normal and S2 normal. No murmur heard. Pulmonary:     Effort: Pulmonary effort is normal.     Breath sounds: Normal breath sounds. No wheezing, rhonchi or rales.     Comments: Reactive cough with deep breathing Psychiatric:        Behavior: Behavior is cooperative.      UC Treatments / Results  Labs (all labs ordered are listed, but only abnormal results are displayed) Labs Reviewed - No data to  display  EKG   Radiology No results found.  Procedures Procedures (including critical care time)  Medications Ordered in UC  Medications - No data to display  Initial Impression / Assessment and Plan / UC Course  I have reviewed the triage vital signs and the nursing notes.  Pertinent labs & imaging results that were available during my care of the patient were reviewed by me and considered in my medical decision making (see chart for details).     Patient is well-appearing, afebrile, nontoxic, nontachycardic.  No indication for viral testing as she has been symptomatic for several weeks and this would not change management.  Given prolonged and worsening symptoms will cover for secondary bacterial infection with Augmentin twice daily for 7 days.  She was also started on a prednisone burst of 40 mg for 4 days with instructions to take NSAIDs with this medication due to risk of GI bleeding.  Recommend over-the-counter medication including Mucinex, Flonase, Tylenol for additional symptom relief.  Recommended nasal saline and sinus rinses for additional symptom relief.  Discussed that if her symptoms or not proving within a week she is to return for reevaluation.  If she has any worsening or changing symptoms including severe cough, shortness of breath, fever, nausea/vomiting interfering with oral intake she should be seen immediately.  Strict return precautions given.  Final Clinical Impressions(s) / UC Diagnoses   Final diagnoses:  Sinobronchitis     Discharge Instructions      Start Augmentin twice daily to cover for infection.  Take prednisone 40 mg for 4 days.  Do not take NSAIDs with this medication including aspirin, ibuprofen/Advil, naproxen/Aleve.  You can use acetaminophen/Tylenol as well as other over-the-counter medications such as Mucinex and Flonase.  Make sure that you rest and drink plenty of fluid.  You can use nasal saline/sinus rinses for additional symptom relief.  If  your symptoms are not improving within a week return for reevaluation.  If anything worsens you should be seen immediately including chest pain, shortness of breath, worsening cough, nausea/vomiting interfering with oral intake.    ED Prescriptions     Medication Sig Dispense Auth. Provider   promethazine-dextromethorphan (PROMETHAZINE-DM) 6.25-15 MG/5ML syrup Take 5 mLs by mouth at bedtime as needed for cough. 100 mL Jahree Dermody K, PA-C   amoxicillin-clavulanate (AUGMENTIN) 875-125 MG tablet Take 1 tablet by mouth every 12 (twelve) hours. 14 tablet Karlis Cregg K, PA-C   predniSONE (DELTASONE) 20 MG tablet Take 2 tablets (40 mg total) by mouth daily for 4 days. 8 tablet Marquist Binstock, Derry Skill, PA-C      PDMP not reviewed this encounter.   Terrilee Croak, PA-C 06/03/22 1845

## 2024-02-01 ENCOUNTER — Ambulatory Visit: Payer: Self-pay | Admitting: Nurse Practitioner

## 2024-02-01 NOTE — Telephone Encounter (Signed)
 FYI Only or Action Required?: Action required by provider: request for appointment.  Patient was last seen in primary care on .  Called Nurse Triage reporting Back Pain.  Symptoms began a week ago.  Interventions attempted: Rest, hydration, or home remedies.  Symptoms are: unchanged. Will go to UC for acute issue and New Pt. Appointment made.  Triage Disposition: See PCP When Office is Open (Within 3 Days)  Patient/caregiver understands and will follow disposition?: Yes     Copied from CRM #[id number]. Topic: Clinical - Red Word Triage >> Feb 01, 2024  3:02 PM Delon HERO wrote: Red Word that prompted transfer to Nurse Triage: Patient husband is calling to report that her left side into her back is hurting for one week. Requesting appointment for Monday or Tuesday. Reason for Disposition  [1] MODERATE back pain (e.g., interferes with normal activities) AND [2] present > 3 days  Answer Assessment - Initial Assessment Questions 1. ONSET: When did the pain begin? (e.g., minutes, hours, days)     1 week 2. LOCATION: Where does it hurt? (upper, mid or lower back)     Lower right 3. SEVERITY: How bad is the pain?  (e.g., Scale 1-10; mild, moderate, or severe)     severe 4. PATTERN: Is the pain constant? (e.g., yes, no; constant, intermittent)      Comes and goes 5. RADIATION: Does the pain shoot into your legs or somewhere else?     no 6. CAUSE:  What do you think is causing the back pain?      unsure 7. BACK OVERUSE:  Any recent lifting of heavy objects, strenuous work or exercise?     no 8. MEDICINES: What have you taken so far for the pain? (e.g., nothing, acetaminophen , NSAIDS)     no 9. NEUROLOGIC SYMPTOMS: Do you have any weakness, numbness, or problems with bowel/bladder control?     no 10. OTHER SYMPTOMS: Do you have any other symptoms? (e.g., fever, abdomen pain, burning with urination, blood in urine)       no 11. PREGNANCY: Is there any chance you  are pregnant? When was your last menstrual period?       no  Protocols used: Back Pain-A-AH

## 2024-02-04 ENCOUNTER — Encounter: Payer: Self-pay | Admitting: *Deleted

## 2024-02-04 LAB — GLUCOSE, POCT (MANUAL RESULT ENTRY): POC Glucose: 117 mg/dL — AB (ref 70–99)

## 2024-02-04 NOTE — Congregational Nurse Program (Signed)
  Dept: [phone]   Congregational Nurse Program Note  Date of Encounter: 02/04/2024  Past Medical History: Past Medical History:  Diagnosis Date   AMA (advanced maternal age) multigravida 35+    Anemia    Female circumcision    FH: diabetes mellitus    GBS carrier    Gestational diabetes    H/O constipation 04/07/09   H/O hemorrhoids 09/02/09   H/O malaria    H/O malaria    Headache    Hernia, abdominal    History of anemia    Rh negative, maternal    Type O blood, Rh negative 02/19/2012    Encounter Details:  Community Questionnaire - 02/04/24 2044       Questionnaire   Ask client: Do you give verbal consent for me to treat you today? Yes    Student Assistance N/A    Location Patient Served  [facility]    Encounter Setting CN site    Population Status Migrant/Refugee    [employer] or [place] Insurance    Insurance/Financial Assistance Referral N/A    Medication N/A    Medical Provider Yes    Screening Referrals Made N/A    Medical Referrals Made N/A    Medical Appointment Completed N/A    CNP Interventions Advocate/Support;Counsel;Educate    Screenings CN Performed Blood Pressure;Blood Glucose    ED Visit Averted Yes    Life-Saving Intervention Made N/A         Lugene Ropes, RN, MSN, CNP [phone] Office [phone] Cell

## 2024-02-05 ENCOUNTER — Encounter (HOSPITAL_COMMUNITY): Payer: Self-pay

## 2024-02-05 ENCOUNTER — Ambulatory Visit (HOSPITAL_COMMUNITY)
Admission: EM | Admit: 2024-02-05 | Discharge: 2024-02-05 | Disposition: A | Discharge status: Home / Self Care | Attending: Family Medicine | Admitting: Family Medicine

## 2024-02-05 DIAGNOSIS — M545 Low back pain, unspecified: Secondary | ICD-10-CM | POA: Diagnosis not present

## 2024-02-05 DIAGNOSIS — G8929 Other chronic pain: Secondary | ICD-10-CM

## 2024-02-05 MED ORDER — LIDOCAINE 5 % EX PTCH: 1.0000 | MEDICATED_PATCH | CUTANEOUS | 0 refills | Status: AC

## 2024-02-05 MED ORDER — KETOROLAC TROMETHAMINE 30 MG/ML IJ SOLN
30.0000 mg | Freq: Once | INTRAMUSCULAR | Status: AC
  Administered 2024-02-05: 30 mg via INTRAMUSCULAR

## 2024-02-05 MED ORDER — TIZANIDINE HCL 4 MG PO TABS: 4.0000 mg | ORAL_TABLET | Freq: Three times a day (TID) | ORAL | 0 refills | Status: AC | PRN

## 2024-02-05 MED ORDER — KETOROLAC TROMETHAMINE 30 MG/ML IJ SOLN
INTRAMUSCULAR | Status: AC
  Filled 2024-02-05: qty 1

## 2024-02-05 NOTE — ED Provider Notes (Signed)
 [facility]    CSN: [id number] Arrival date & time: 02/05/24  9066      History   Chief Complaint Chief Complaint  Patient presents with   Back Pain    HPI Jessica Cervantes is a 53 y.o. female.    Back Pain  Patient is here for left low back pain x 10 days.   She does have pain in her feet at times, but no radiation of pain down the leg.  No numbness or tingling noted.  No n/v.  No urinary symptoms noted.  She may use vicks with some help.  She does have h/o back pain, but this time is worse and sticking around for longer.        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   Anemia 04/15/2012   Type O blood, Rh negative 02/19/2012   Cough, persistent 10/10/2011   AMA (advanced maternal age) multigravida 35+ 08/29/2011   Hx of gestational diabetes in prior pregnancy, currently pregnant 08/29/2011   H/O malaria    Language Barrier (Arabic) 08/14/2008    Past Surgical History:  Procedure Laterality Date   GALLBLADDER SURGERY  2010   INCISIONAL HERNIA REPAIR N/A 05/21/2014   Procedure: INCISIONAL HERNIA REPAIR;  Surgeon: Vicenta Poli, MD;  Location: [facility];  Service: General;  Laterality: N/A;   INSERTION OF MESH N/A 05/21/2014   Procedure: INSERTION OF MESH;  Surgeon: Vicenta Poli, MD;  Location: [facility];  Service: General;  Laterality: N/A;   UPPER GI ENDOSCOPY      OB History     Gravida  5    Para  4   Term  4   Preterm      AB  1   Living  4      SAB  1   IAB      Ectopic      Multiple  0   Live Births  4            Home Medications    Prior to Admission medications   Not on File    Family History Family History  Problem Relation Age of Onset   Diabetes Mother     Social History Social History   Tobacco Use   Smoking status: Never   Smokeless tobacco: Never  Substance Use Topics   Alcohol use: No   Drug use: No     Allergies   Patient has no known allergies.   Review of Systems Review of Systems  Constitutional: Negative.   HENT: Negative.    Respiratory: Negative.    Cardiovascular: Negative.   Gastrointestinal: Negative.   Genitourinary: Negative.   Musculoskeletal:  Positive for back pain.  Psychiatric/Behavioral: Negative.       Physical Exam Triage Vital Signs ED Triage Vitals  Encounter Vitals  Group     BP 02/05/24 1042 136/85     Girls Systolic BP Percentile --      Girls Diastolic BP Percentile --      Boys Systolic BP Percentile --      Boys Diastolic BP Percentile --      Pulse Rate 02/05/24 1042 60     Resp --      Temp 02/05/24 1042 98 F (36.7 C)     Temp Source 02/05/24 1042 Oral     SpO2 02/05/24 1042 96 %     Weight --      Height --      Head Circumference --      Peak Flow --      Pain Score 02/05/24 1044 6     Pain Loc --      Pain Education --      Exclude from Growth Chart --    No data found.  Updated Vital Signs BP 136/85 (BP Location: Right Arm)   Pulse 60   Temp 98 F (36.7 C) (Oral)   LMP  (LMP Unknown)   SpO2 96%   Breastfeeding No   Visual Acuity Right Eye Distance:   Left Eye Distance:   Bilateral Distance:    Right Eye Near:   Left Eye Near:    Bilateral Near:     Physical Exam Constitutional:      General: She is not in acute distress.    Appearance: She is normal weight. She is not ill-appearing or toxic-appearing.  Cardiovascular:     Rate and Rhythm:  Normal rate and regular rhythm.  Pulmonary:     Effort: Pulmonary effort is normal.     Breath sounds: Normal breath sounds.  Musculoskeletal:     Comments: TTP to the lumbar spine;  no TTP to the paraspinals at this time.  Full rom without pain or limitation;  straight leg raise negative  Skin:    General: Skin is warm.  Neurological:     General: No focal deficit present.     Mental Status: She is alert.  Psychiatric:        Mood and Affect: Mood normal.      UC Treatments / Results  Labs (all labs ordered are listed, but only abnormal results are displayed) Labs Reviewed - No data to display  EKG   Radiology No results found.  Procedures Procedures (including critical care time)  Medications Ordered in UC Medications  ketorolac  (TORADOL ) 30 MG/ML injection 30 mg (has no administration in time range)    Initial Impression / Assessment and Plan / UC Course  I have reviewed the triage vital signs and the nursing notes.  Pertinent labs & imaging results that were available during my care of the patient were reviewed by me and considered in my medical decision making (see chart for details).    Final Clinical Impressions(s) / UC Diagnoses   Final diagnoses:  Chronic left-sided low back pain without sciatica     Discharge Instructions      ?????? ????? ???? ??? ?? ?????. ?????? ???? ?? ???????? ?????. ?????? ?? ???? ???? ????? ????? ????? ????? ??????? ??? ????????. ?? ????? ?? ??? ?????? ????? ???????? ??? ????? ?????? ?? ?????? ???? ???????. ????? ????? ??????? ????? ?????. ????? ?????? ??? ?? ????? ????? ??? ?? ?????. rajaetuk alyawm bisabab 'alam fi alzahri. 'aetaytuk jureatan min almusakinat lil'almi. arslt lak wasfatan tibiyatan liruqeat muskanat lil'alam wmrkh lileadalat 'iilaa alsaydaliati. qad yusbb  lak hadha aldawa' altaeab walnueasi, lidha yurja tanawuluh fi almanzil waeadam alqiadati. [place] aydan aistikhdam wisadat tadfiatin. yurja 'iieadatuh 'iidha  lam tatahasan halatuk kama hu mutawaqae.   You were seen today for back pain.  I have given you a shot of mediation for pain today.  I have sent out a prescription for a pain patch, and a muscle relaxer to your pharmacy.  This could make you tired/drowsy so please take when home and not driving.  You may use a heating pad as well.  Please return if not improving as expected.     ED Prescriptions     Medication Sig Dispense Auth. Provider   lidocaine  (LIDODERM ) 5 % Place 1 patch onto the skin daily. Remove & Discard patch within 12 hours or as directed by MD 30 patch Kallee Nam, MD   tiZANidine (ZANAFLEX) 4 MG tablet Take 1 tablet (4 mg total) by mouth every 8 (eight) hours as needed. 30 tablet Darral Longs, MD      PDMP not reviewed this encounter.   Darral Longs, MD 02/05/24 1114

## 2024-02-05 NOTE — ED Triage Notes (Signed)
 Patient here today with c/o left side low back pain X 10 days. Patient has tried taking Tylenol , Ibuprofen , and a Vicks cream with some relief. Patient has a h/o back pain but this time is worse.

## 2024-02-05 NOTE — Discharge Instructions (Addendum)
?????? ????? ???? ??? ?? ?????. ?????? ???? ?? ???????? ?????. ?????? ?? ???? ???? ????? ????? ????? ????? ??????? ??? ????????. ?? ????? ?? ??? ?????? ????? ???????? ??? ????? ?????? ?? ?????? ???? ???????. ????? ????? ??????? ????? ?????. ????? ?????? ??? ?? ????? ????? ??? ?? ?????.   rajaetuk alyawm bisabab 'alam fi alzahri. 'aetaytuk jureatan min almusakinat lil'almi. arslt lak wasfatan tibiyatan liruqeat muskanat lil'alam wmrkh lileadalat 'iilaa alsaydaliati. qad yusbb lak hadha aldawa' altaeab walnueasi, lidha yurja tanawuluh fi almanzil waeadam alqiadati. [place] aydan aistikhdam wisadat tadfiatin. yurja 'iieadatuh 'iidha lam tatahasan halatuk kama hu mutawaqae.   You were seen today for back pain.  I have given you a shot of mediation for pain today.  I have sent out a prescription for a pain patch, and a muscle relaxer to your pharmacy.  This could make you tired/drowsy so please take when home and not driving.  You may use a heating pad as well.  Please return if not improving as expected.

## 2024-03-20 ENCOUNTER — Ambulatory Visit: Payer: Self-pay | Admitting: Family Medicine
